# Patient Record
Sex: Male | Born: 1965 | Race: Asian | Hispanic: No | Marital: Married | Smoking: Never smoker
Health system: Southern US, Community
[De-identification: ages and names within clinical notes are randomized; demographics above are authoritative.]

## PROBLEM LIST (undated history)

## (undated) DIAGNOSIS — R7302 Impaired glucose tolerance (oral): Secondary | ICD-10-CM

## (undated) DIAGNOSIS — D751 Secondary polycythemia: Secondary | ICD-10-CM

## (undated) DIAGNOSIS — F419 Anxiety disorder, unspecified: Secondary | ICD-10-CM

## (undated) DIAGNOSIS — E569 Vitamin deficiency, unspecified: Secondary | ICD-10-CM

## (undated) DIAGNOSIS — R208 Other disturbances of skin sensation: Secondary | ICD-10-CM

## (undated) DIAGNOSIS — H539 Unspecified visual disturbance: Secondary | ICD-10-CM

## (undated) DIAGNOSIS — E785 Hyperlipidemia, unspecified: Secondary | ICD-10-CM

## (undated) HISTORY — DX: Impaired glucose tolerance (oral): R73.02

## (undated) HISTORY — PX: ANAL FISSURE REPAIR: SHX2312

## (undated) HISTORY — DX: Vitamin deficiency, unspecified: E56.9

## (undated) HISTORY — PX: TONSILLECTOMY: SUR1361

## (undated) HISTORY — DX: Secondary polycythemia: D75.1

## (undated) HISTORY — DX: Anxiety disorder, unspecified: F41.9

## (undated) HISTORY — DX: Unspecified visual disturbance: H53.9

## (undated) HISTORY — DX: Hyperlipidemia, unspecified: E78.5

## (undated) HISTORY — DX: Other disturbances of skin sensation: R20.8

---

## 2008-05-05 ENCOUNTER — Ambulatory Visit: Payer: Self-pay | Admitting: Pulmonary Disease

## 2008-05-05 DIAGNOSIS — R05 Cough: Secondary | ICD-10-CM

## 2008-05-18 ENCOUNTER — Encounter: Admission: RE | Admit: 2008-05-18 | Discharge: 2008-05-18 | Payer: Self-pay | Admitting: Internal Medicine

## 2008-10-07 ENCOUNTER — Ambulatory Visit (HOSPITAL_COMMUNITY): Admission: RE | Admit: 2008-10-07 | Discharge: 2008-10-07 | Payer: Self-pay | Admitting: Gastroenterology

## 2008-10-07 IMAGING — CT CT ABDOMEN W/ CM
2 of 5 series · 17 of 46 positions shown, 19 images · IV contrast (agent unspecified)
Comparison: None.

CLINICAL DATA: Right upper quadrant abdominal pain.

CT ABDOMEN WITH CONTRAST
TECHNIQUE: Multidetector CT imaging of the abdomen was performed
following the standard protocol during bolus administration of
intravenous contrast.
Contrast: 100 ml [JI]

[Series 2: abdomen 5.0 b40s · axial · 0.76mm/px · z∈[-317,-57]mm · 14 of 60 slices shown, 16 images]
[im 4/60  soft-tissue]
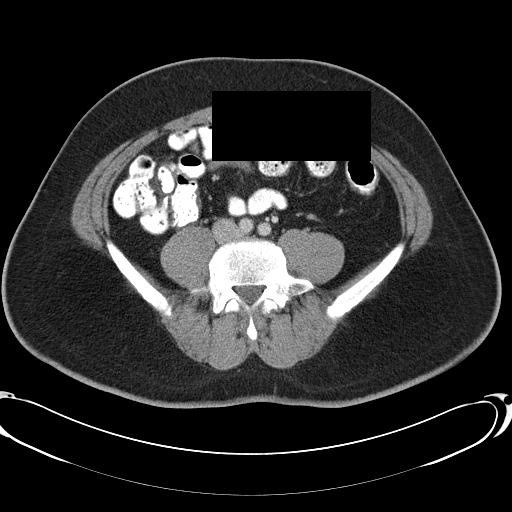
[im 4/60  bone]
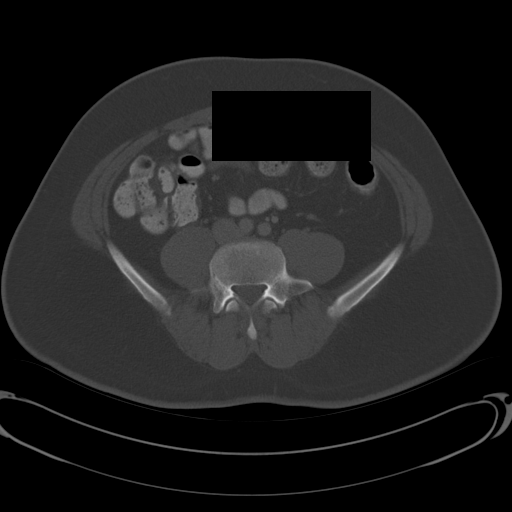
[im 8/60  soft-tissue]
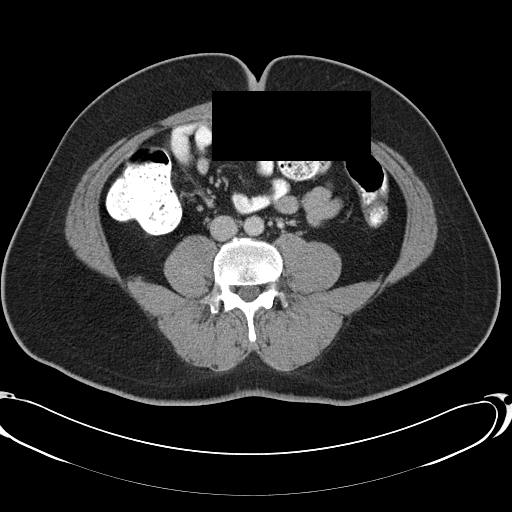
[im 12/60  soft-tissue]
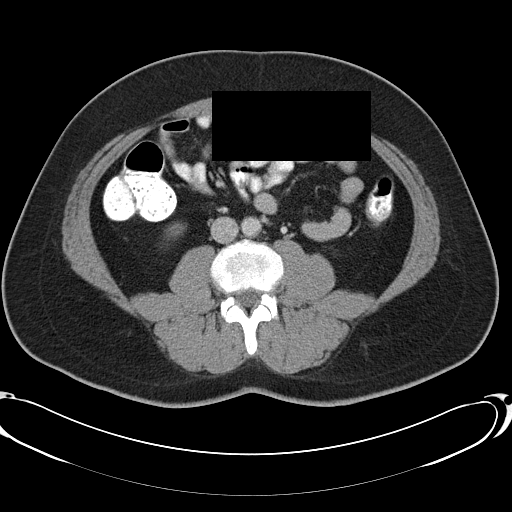
[im 15/60  soft-tissue]
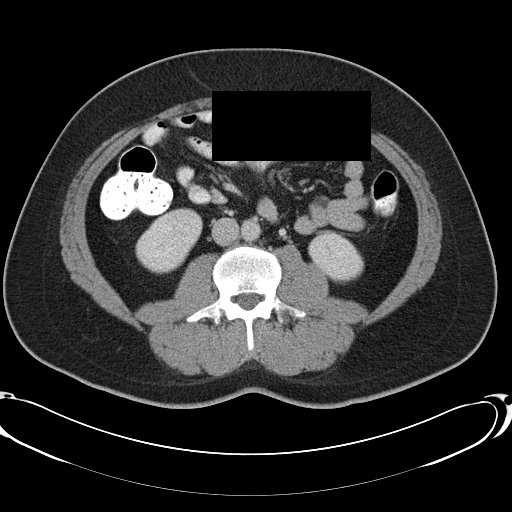
[im 19/60  soft-tissue]
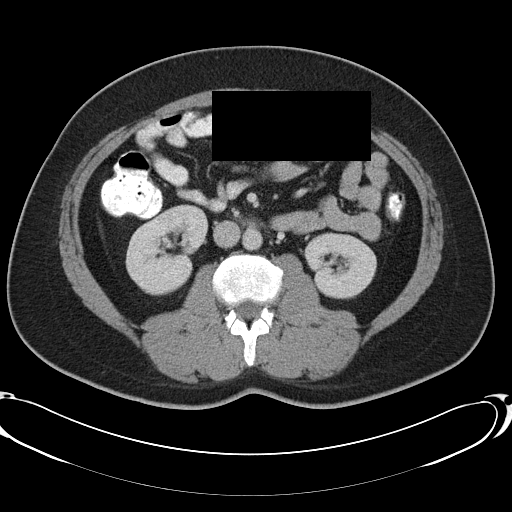
[im 23/60  soft-tissue]
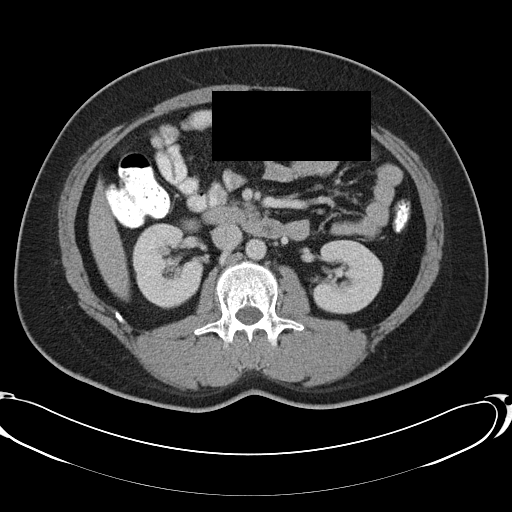
[im 26/60  soft-tissue]
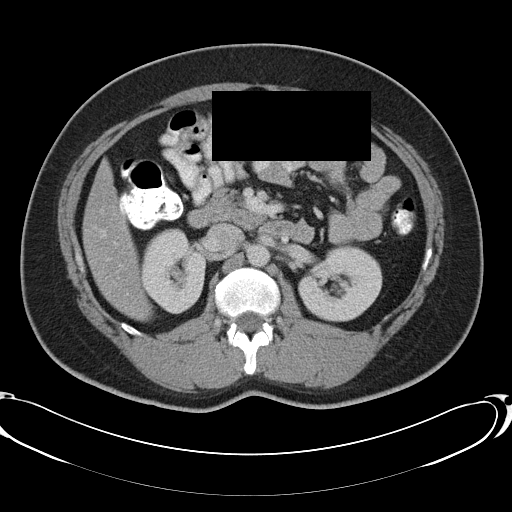
[im 34/60  soft-tissue]
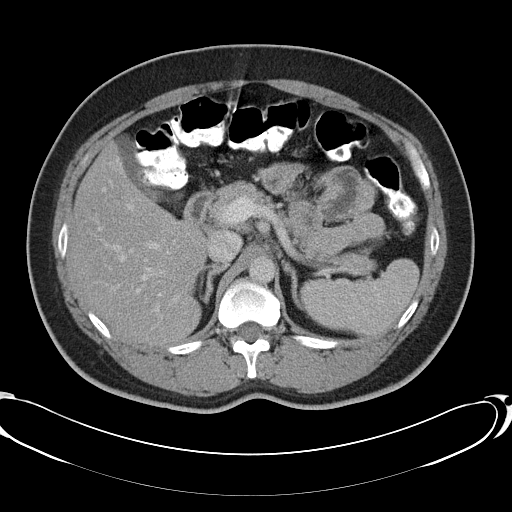
[im 37/60  soft-tissue]
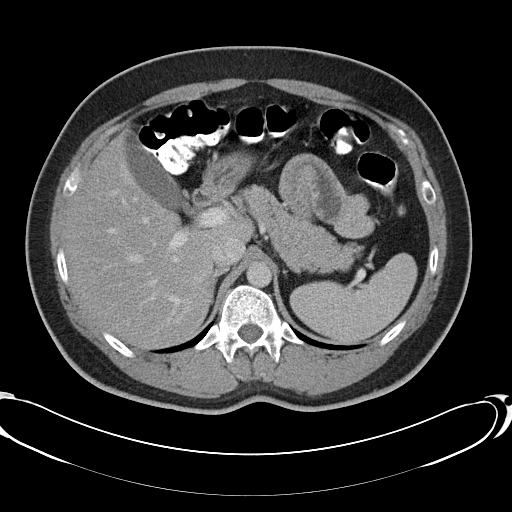
[im 37/60  bone]
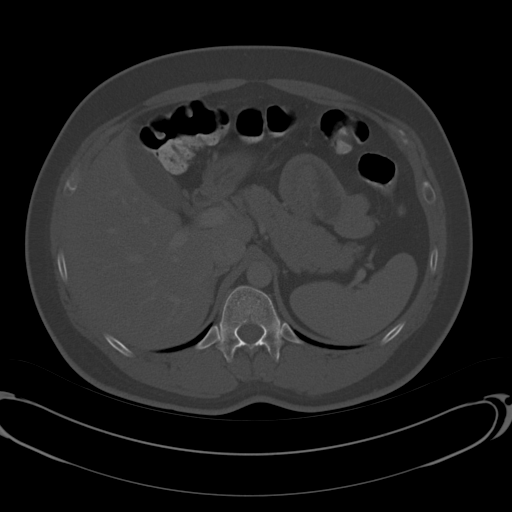
[im 41/60  soft-tissue]
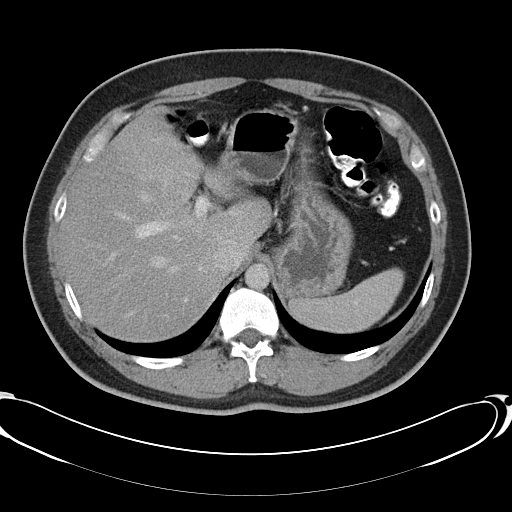
[im 45/60  soft-tissue]
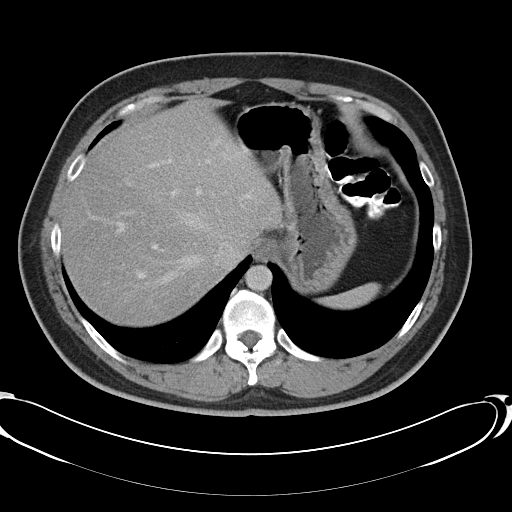
[im 48/60  soft-tissue]
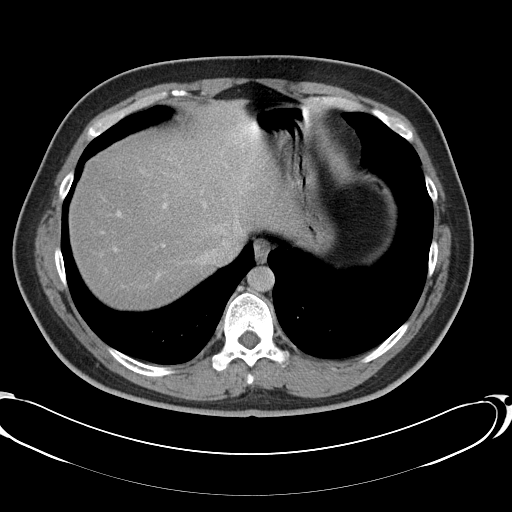
[im 52/60  soft-tissue]
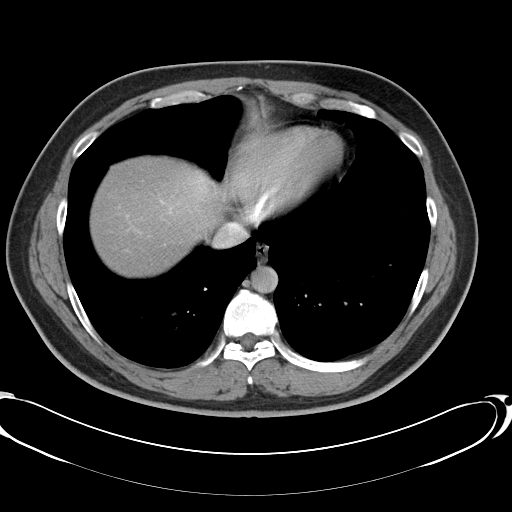
[im 56/60  soft-tissue]
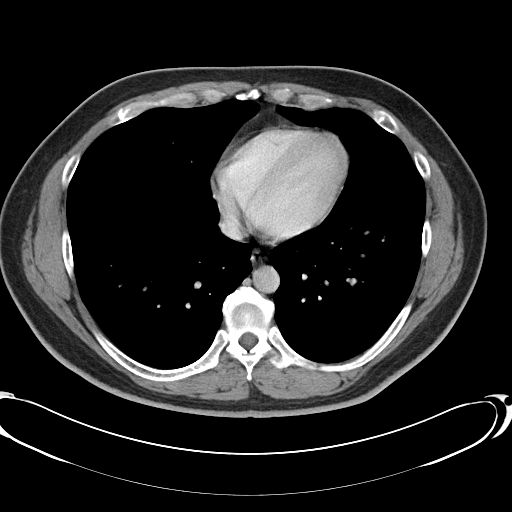

[Series 602: <mpr thick range> · coronal · 0.76mm/px · 3 of 82 slices shown]
[im 28/82  soft-tissue]
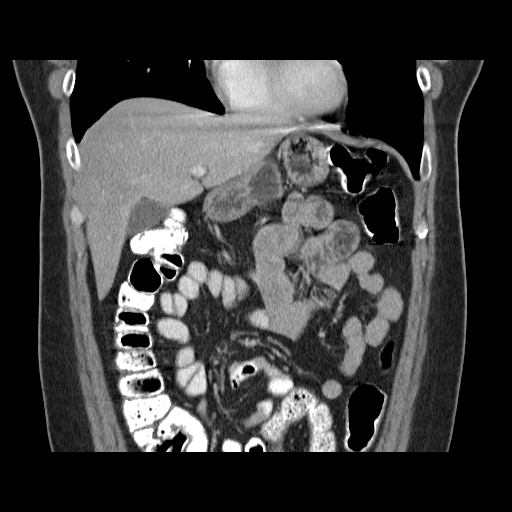
[im 37/82  soft-tissue]
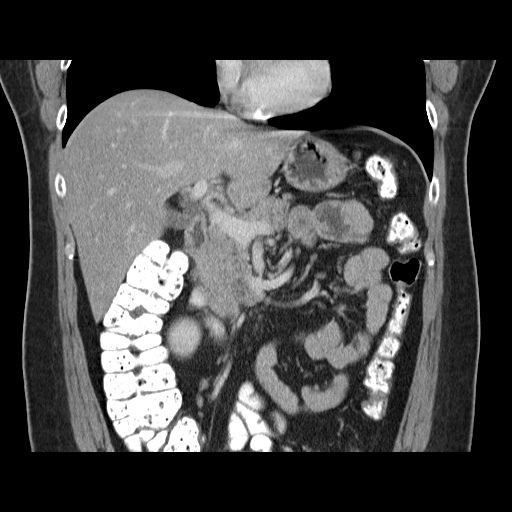
[im 46/82  soft-tissue]
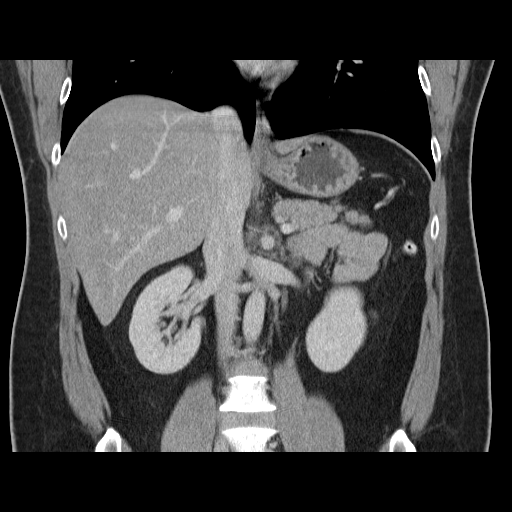

[17 of 46 positions shown; findings below may reference images not displayed]

FINDINGS: The liver shows diffusely decreased attenuation,
consistent with a degree of steatosis.  Craniocaudal length of the
liver is 18.3 cm.  No focal abnormalities identified in the liver
or spleen.  The stomach, duodenum, pancreas, gallbladder, adrenal
glands, and right kidney show normal imaging features.  An 11 mm
well-defined hypoattenuating lesion in the central left kidney is
likely a cyst.

There is no abdominal aortic aneurysm.  No intraperitoneal free
fluid.  No abdominal lymphadenopathy.

Bone windows show no focal lytic or sclerotic osseous lesion.
IMPRESSION: Fatty, mildly enlarged liver without focal abnormality.

Tiny hypodensity in the central left kidney is most likely a cyst.

## 2009-07-26 ENCOUNTER — Ambulatory Visit (HOSPITAL_COMMUNITY): Admission: RE | Admit: 2009-07-26 | Discharge: 2009-07-26 | Payer: Self-pay | Admitting: Neurosurgery

## 2009-08-24 ENCOUNTER — Encounter: Admission: RE | Admit: 2009-08-24 | Discharge: 2009-10-10 | Payer: Self-pay | Admitting: Neurosurgery

## 2009-12-04 ENCOUNTER — Telehealth: Payer: Self-pay | Admitting: *Deleted

## 2010-11-27 NOTE — Progress Notes (Signed)
Summary: LMTCB for appt.  Phone Note Call from Patient   Caller: Mom Call For: Berniece Andreas, MD Summary of Call: Pt's mom left message requesting appt with Dr. Fabian Sharp,. LMTCB for more information.  Initial call taken by: Lynann Beaver CMA,  December 04, 2009 11:45 AM  Follow-up for Phone Call        Pt to come in today for a 1pm appt. Follow-up by: Romualdo Bolk, CMA (AAMA),  December 04, 2009 12:13 PM

## 2011-01-04 ENCOUNTER — Other Ambulatory Visit (INDEPENDENT_AMBULATORY_CARE_PROVIDER_SITE_OTHER): Payer: 59 | Admitting: Internal Medicine

## 2011-01-04 DIAGNOSIS — Z209 Contact with and (suspected) exposure to unspecified communicable disease: Secondary | ICD-10-CM

## 2011-07-10 ENCOUNTER — Other Ambulatory Visit: Payer: Self-pay | Admitting: Gastroenterology

## 2011-07-11 ENCOUNTER — Ambulatory Visit
Admission: RE | Admit: 2011-07-11 | Discharge: 2011-07-11 | Disposition: A | Discharge status: Home / Self Care | Payer: 59 | Source: Ambulatory Visit | Attending: Gastroenterology | Admitting: Gastroenterology

## 2012-12-12 ENCOUNTER — Other Ambulatory Visit: Payer: Self-pay

## 2013-09-02 ENCOUNTER — Other Ambulatory Visit: Payer: Self-pay

## 2014-07-08 ENCOUNTER — Encounter: Payer: Self-pay | Admitting: Family Medicine

## 2015-01-05 ENCOUNTER — Ambulatory Visit (HOSPITAL_COMMUNITY)
Admission: RE | Admit: 2015-01-05 | Discharge: 2015-01-05 | Disposition: A | Discharge status: Home / Self Care | Payer: 59 | Source: Ambulatory Visit | Attending: Internal Medicine | Admitting: Internal Medicine

## 2015-01-05 ENCOUNTER — Other Ambulatory Visit (HOSPITAL_COMMUNITY): Payer: Self-pay | Admitting: Internal Medicine

## 2015-01-05 DIAGNOSIS — G44209 Tension-type headache, unspecified, not intractable: Secondary | ICD-10-CM

## 2015-09-26 ENCOUNTER — Encounter: Payer: Self-pay | Admitting: Diagnostic Neuroimaging

## 2015-09-26 ENCOUNTER — Ambulatory Visit (INDEPENDENT_AMBULATORY_CARE_PROVIDER_SITE_OTHER): Payer: 59 | Admitting: Diagnostic Neuroimaging

## 2015-09-26 ENCOUNTER — Encounter: Payer: Self-pay | Admitting: *Deleted

## 2015-09-26 VITALS — BP 122/76 | HR 74 | Resp 16 | Ht 73.0 in | Wt 203.0 lb

## 2015-09-26 DIAGNOSIS — M79672 Pain in left foot: Secondary | ICD-10-CM | POA: Diagnosis not present

## 2015-09-26 DIAGNOSIS — M79671 Pain in right foot: Secondary | ICD-10-CM

## 2015-09-26 DIAGNOSIS — R200 Anesthesia of skin: Secondary | ICD-10-CM

## 2015-09-26 DIAGNOSIS — R208 Other disturbances of skin sensation: Secondary | ICD-10-CM | POA: Diagnosis not present

## 2015-09-26 MED ORDER — METANX 3-35-2 MG PO TABS: 1.0000 | ORAL_TABLET | Freq: Every day | ORAL | Status: DC

## 2015-09-26 NOTE — Patient Instructions (Signed)
Thank you for coming to see [place] at [facility]. I hope we have been able to provide you high quality care today.  You may receive a patient satisfaction survey over the next few weeks. We would appreciate your feedback and comments so that we may continue to improve ourselves and the health of our patients.  - try metanx 1 tab twice a day or 2 tabs daily   ~~~~~~~~~~~~~~~~~~~~~~~~~~~~~~~~~~~~~~~~~~~~~~~~~~~~~~~~~~~~~~~~~  DR. Christopher Hink'S GUIDE TO HAPPY AND HEALTHY LIVING These are some of my general health and wellness recommendations. Some of them may apply to you better than others. Please use common sense as you try these suggestions and feel free to ask me any questions.   ACTIVITY/FITNESS Mental, social, emotional and physical stimulation are very important for brain and body health. Try learning a new activity (arts, music, language, sports, games).  Keep moving your body to the best of your abilities. You can do this at home, inside or outside, the park, community center, gym or anywhere you like. Consider a physical therapist or personal trainer to get started. Consider the app Sworkit. Fitness trackers such as smart-watches, smart-phones or Fitbits can help as well.   NUTRITION Eat more plants: colorful vegetables, nuts, seeds and berries.  Eat less sugar, salt, preservatives and processed foods.  Avoid toxins such as cigarettes and alcohol.  Drink water when you are thirsty. Warm water with a slice of lemon is an excellent morning drink to start the day.  Consider these websites for more information The Nutrition Source (https://www.henry-hernandez.biz/) Precision Nutrition (WindowBlog.ch)   RELAXATION Consider practicing mindfulness meditation or other relaxation techniques such as deep breathing, prayer, yoga, tai chi, massage. See website mindful.org or the apps Headspace or Calm to help get  started.   SLEEP Try to get at least 7-8+ hours sleep per day. Regular exercise and reduced caffeine will help you sleep better. Practice good sleep hygeine techniques. See website sleep.org for more information.   PLANNING Prepare estate planning, living will, healthcare POA documents. Sometimes this is best planned with the help of an attorney. Theconversationproject.org and agingwithdignity.org are excellent resources.

## 2015-09-26 NOTE — Progress Notes (Signed)
[facility]  PATIENT: Dustin [name] DOB: 29-Apr-1966  REFERRING CLINICIAN: Ashby Dawes HISTORY FROM: patient  REASON FOR VISIT: new consult    HISTORICAL  CHIEF COMPLAINT:  Chief Complaint  Patient presents with  . 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  Also c/o intermittent cold sensation entire legs,  buttocks onset about 3 weeks ago.  He has not tried any medications for sx. and prefers not to/fim    HISTORY OF PRESENT ILLNESS:   49 year old male here for evaluation of numbness, burning in the feet for past 1 month.  Approximate one month ago patient had onset of intermittent burning sensation in the bottom of the feet. Initially right foot was more affected than the left. Symptoms would fluctuate and seem to be aggravated if he was sitting upright or standing upright for long periods of time. Over the past one month symptoms have gradually increased in severity, frequency and duration. He has had several episodes of a cold sensation, migratory in the bilateral lower extremities ranging from the calves, thighs and hip regions. He has had some intermittent pain in the left upper thoracic region which has spontaneously subsided.  No focal weakness, balance difficulty, problems with fingers hands or arms. No vision changes, headaches, slurred speech or trouble talking.  Patient has history of vitamin D deficiency and is on vitamin D replacement therapy. Patient had hemoglobin A1c which was borderline at 6.1. Patient has family history of diabetes and patient's father has similar burning sensation in the feet.  No prodromal infections, traumas or other triggering factors.    REVIEW OF SYSTEMS: Full 14 system review of systems performed and notable only as per history of present illness.  ALLERGIES: No Known Allergies  HOME MEDICATIONS: No outpatient prescriptions prior to visit.    No facility-administered medications prior to visit.    PAST MEDICAL HISTORY: Past Medical History  Diagnosis Date  . Vision abnormalities     PAST SURGICAL HISTORY: Past Surgical History  Procedure Laterality Date  . Tonsillectomy      FAMILY HISTORY: Family History  Problem Relation Age of Onset  . Other Mother   . Diabetes type II Father   . Coronary artery disease Father     SOCIAL HISTORY:  Social History   Social History  . Marital Status: Married    Spouse Name: N/A  . Number of Children: N/A  . Years of Education: N/A   Occupational History  . Not on file.   Social History Main Topics  . Smoking status: Never Smoker   . Smokeless tobacco: Not on file  . Alcohol Use: 0.0 oz/week    0 Standard drinks or equivalent per week     Comment: occasional  . Drug Use: No  . Sexual Activity: Not on file   Other Topics Concern  . Not on file   Social History Narrative  . No narrative on file     PHYSICAL EXAM  GENERAL EXAM/CONSTITUTIONAL: Vitals:  Filed Vitals:   09/26/15 1514  BP: 122/76  Pulse: 74  Resp: 16  Height: 6\' 1"  (1.854 m)  Weight: 203 lb (92.08 kg)     Body mass index is 26.79 kg/(m^2).  No exam data present  Patient is in no distress; well developed, nourished and groomed; neck is supple  STRAIGHT LEG RAISE NEGATIVE   CARDIOVASCULAR:  Examination of carotid arteries is normal; no carotid bruits  Regular rate and rhythm, no murmurs  Examination of peripheral vascular system by observation and palpation is normal  EYES:  Ophthalmoscopic exam of optic discs and posterior segments is normal; no papilledema or hemorrhages  MUSCULOSKELETAL:  Gait, strength, tone, movements noted in Neurologic exam below  NEUROLOGIC: MENTAL STATUS:  No flowsheet data found.  awake, alert, oriented to person, place and time  recent and remote memory intact  normal attention and concentration  language fluent, comprehension  intact, naming intact,   fund of knowledge appropriate  CRANIAL NERVE:   2nd - no papilledema on fundoscopic exam  2nd, 3rd, 4th, 6th - pupils equal and reactive to light, visual fields full to confrontation, extraocular muscles intact, no nystagmus  5th - facial sensation symmetric  7th - facial strength symmetric  8th - hearing intact  9th - palate elevates symmetrically, uvula midline  11th - shoulder shrug symmetric  12th - tongue protrusion midline  MOTOR:   normal bulk and tone, full strength in the BUE, BLE  SENSORY:   normal and symmetric to light touch, temperature and proprioception; BORDERLINE REDUCED VIBRATION AT TOES (~8 SEC); SLIGHTLY DECREASED PP SENSATION IN BOTTOM OF FEET (SYMM)  COORDINATION:   finger-nose-finger, fine finger movements normal  REFLEXES:   deep tendon reflexes present and symmetric  GAIT/STATION:   narrow based gait; able to walk on toes, heels and tandem; romberg is negative    DIAGNOSTIC DATA (LABS, IMAGING, TESTING) - I reviewed patient records, labs, notes, testing and imaging myself where available.  No results found for: WBC, HGB, HCT, MCV, PLT No results found for: NA, K, CL, CO2, GLUCOSE, BUN, CREATININE, CALCIUM, PROT, ALBUMIN, AST, ALT, ALKPHOS, BILITOT, GFRNONAA, GFRAA No results found for: CHOL, HDL, LDLCALC, LDLDIRECT, TRIG, CHOLHDL No results found for: HGBA1C No results found for: VITAMINB12 No results found for: TSH   OUTSIDE LABS: A1c 6.1, VitD 28, VitB12 1372, TSH 2.630   07/27/09 MRI LUMBAR [I reviewed images myself and agree with interpretation. -VRP]  - Minimal desiccation and bulging of the L3-4 disc. No apparent neural compression. - Minimal desiccation and bulging of the L4-5 disc. Minimal facet degeneration. No apparent compressive stenosis. No pronounced facet arthropathy.        ASSESSMENT AND PLAN  49 y.o. year old male here with new onset of burning sensation in bottom of feet for  past 1 month. Also with intermittent migratory cool sensations in bilateral legs. Neurologic examination notable for slight decreased sensation in bottom of feet and toes but otherwise normal. Findings could represent onset of small fiber neuropathy. No objective evidence for central nervous system pathologic process at this time. Borderline diabetes or impaired glucose tolerance are common causes for this problem. Other screening lab testings per PCP have been unremarkable.  At this time I advised observation over the next 1 month and repeat neurologic examination. In the future we may consider EMG/nerve conduction testing, skin biopsy for epidermal nerve fiber density testing, as well as further screening for less common etiologies of neuropathy including autoimmune, inflammatory, demyelinating conditions. If signs or symptoms change in the future, could consider brain and spinal cord imaging.   Ddx: small fiber neuropathy (impaired glucose tolerance, autoimmune, inflamm, infectious, hereditary, idiopathic) vs lumbar radiculopathy/spinal stenosis   PLAN: - Observation for the next 1 month - Trial of vitamin support (Metanx) - Encouraged improvements to nutrition and physical activity  Meds ordered this encounter  Medications  . multivitamin (METANX) 3-35-2 MG TABS tablet    Sig:  Take 1-2 tablets by mouth daily.    Dispense:  60 tablet    Refill:  12   Return in about 1 month (around 10/26/2015).    Penni Bombard, MD [id number], [id number] PM Certified in Neurology, Neurophysiology and Neuroimaging  [facility] [address] [place], [place] [address] [phone]

## 2015-11-21 ENCOUNTER — Ambulatory Visit: Payer: 59 | Admitting: Diagnostic Neuroimaging

## 2015-12-04 MED FILL — METFORMIN HCL ER 500 MG TAB: 500 | 90 days supply | Qty: 90 | Fill #1

## 2015-12-21 MED FILL — predniSONE 5 MG (21) TBPK: 5 | 6 days supply | Qty: 21 | Fill #0

## 2015-12-29 ENCOUNTER — Other Ambulatory Visit (INDEPENDENT_AMBULATORY_CARE_PROVIDER_SITE_OTHER): Payer: 59

## 2015-12-29 ENCOUNTER — Encounter: Payer: Self-pay | Admitting: Neurology

## 2015-12-29 ENCOUNTER — Ambulatory Visit (INDEPENDENT_AMBULATORY_CARE_PROVIDER_SITE_OTHER): Payer: 59 | Admitting: Neurology

## 2015-12-29 VITALS — BP 104/70 | HR 95 | Ht 73.0 in | Wt 203.3 lb

## 2015-12-29 DIAGNOSIS — G609 Hereditary and idiopathic neuropathy, unspecified: Secondary | ICD-10-CM

## 2015-12-29 DIAGNOSIS — R209 Unspecified disturbances of skin sensation: Secondary | ICD-10-CM

## 2015-12-29 LAB — COMPREHENSIVE METABOLIC PANEL
ALT: 44 U/L (ref 0–53)
AST: 21 U/L (ref 0–37)
Albumin: 4.6 g/dL (ref 3.5–5.2)
Alkaline Phosphatase: 64 U/L (ref 39–117)
BUN: 20 mg/dL (ref 6–23)
CHLORIDE: 105 meq/L (ref 96–112)
CO2: 30 meq/L (ref 19–32)
CREATININE: 1.1 mg/dL (ref 0.40–1.50)
Calcium: 9.4 mg/dL (ref 8.4–10.5)
GFR: 75.98 mL/min (ref >60.00)
Glucose, Bld: 98 mg/dL (ref 70–99)
Potassium: 4.1 mEq/L (ref 3.5–5.1)
SODIUM: 140 meq/L (ref 135–145)
Total Bilirubin: 0.5 mg/dL (ref 0.2–1.2)
Total Protein: 7.1 g/dL (ref 6.0–8.3)

## 2015-12-29 LAB — CBC
HEMATOCRIT: 53.1 % — AB (ref 39.0–52.0)
Hemoglobin: 17.5 g/dL — ABNORMAL HIGH (ref 13.0–17.0)
MCHC: 33 g/dL (ref 30.0–36.0)
MCV: 89.6 fl (ref 78.0–100.0)
Platelets: 230 10*3/uL (ref 150.0–400.0)
RBC: 5.93 Mil/uL — AB (ref 4.22–5.81)
RDW: 12.9 % (ref 11.5–15.5)
WBC: 5.9 10*3/uL (ref 4.0–10.5)

## 2015-12-29 LAB — SEDIMENTATION RATE: Sed Rate: 2 mm/hr (ref 0–22)

## 2015-12-29 MED ORDER — GABAPENTIN 100 MG PO CAPS: ORAL_CAPSULE | ORAL | Status: DC

## 2015-12-29 MED FILL — ETODOLAC 400 MG TABLET: 400 | 30 days supply | Qty: 60 | Fill #0

## 2015-12-29 MED FILL — CYCLOBENZAPRINE 10 MG TAB: 10 | 30 days supply | Qty: 30 | Fill #0

## 2015-12-29 MED FILL — GABAPENTIN 100 MG CAPSULE: 100 | 30 days supply | Qty: 60 | Fill #0

## 2015-12-29 NOTE — Progress Notes (Signed)
Note routed

## 2015-12-29 NOTE — Patient Instructions (Signed)
1.  Check NCS/EMG of the lower extremities 2.  Check blood work 3.  Start gabapentin 100mg  at bedtime for 3 days, then increase to 2 tablets at bedtime  We will call you with the results of the testing and post them on MyChart

## 2015-12-29 NOTE — Progress Notes (Signed)
[facility] Clinic Note - Initial Visit   Date: 12/29/2015  Dustin [name] MRN: [id number] DOB: February 20, 1966   Dear Dr. Ashby Dawes:  Thank you for your kind referral of Dustin [name] [facility] for consultation of abnormal skin sensation. Although his history is well known to you, please allow [place] to reiterate it for the purpose of our medical record. The patient was accompanied to the clinic by self.     History of Present Illness: Dustin [name] is a 50 y.o. right-handed [place] male with impaired glucose tolerance and vitamin D deficiency presenting for evaluation of abnormal skin sensation.    He reports having transient spells of sharp burning pain in the heels for a number of years.  Starting in November 2016, he began having a burning sensation involving the soles of the feet, lasting several minutes to hours.   It can come in spells lasting for weeks and then resolving spontaneously.  He notices a discomfort when he gets up in the morning and his feet touch the ground and throughout the day, but usually does not have this discomfort when sleeping.  It does not involve the dorsum of the foot or toes.    Also around the same time, has spells of migratory cold/heat sensation occuring randomly over the thighs, legs, and occasionally in the mid back. No alleviating or exacerbating factors. It can occur with activity or at rest. There is no numbness/tingling, weakness, or imbalance. He denies dry eyes, dry mouth.  He does not have similar symptoms involving the upper extremities.   He has impaired glucose intolerance and due to concern of possible early neuropathy, he was started on metformin 540m at bedtime but has not noticed any improvement. He underwent a series of lab testing with his PCP screening for inflammation, thyroid disease, and vitamin B12 deficiency which returned normal.  [facility]    Out-side paper  records, electronic medical record, and images have been reviewed where available and summarized as:  Labs 08/09/2015:  Vitamin B12 1372, vitamin D 28.0*, TSH 2.630, HbA1c 6.1  Past Medical History  Diagnosis Date  . Vision abnormalities     Past Surgical History  Procedure Laterality Date  . Tonsillectomy       Medications:  Outpatient Encounter Prescriptions as of 12/29/2015  Medication Sig Note  . cholecalciferol (VITAMIN D) 1000 UNITS tablet Take 5,000 Units by mouth every other day.   . cyanocobalamin 1000 MCG tablet Take 5,000 mcg by mouth every other day.   . metFORMIN (GLUCOPHAGE-XR) 500 MG 24 hr tablet  12/29/2015: Received from: External Pharmacy  . multivitamin (METANX) 3-35-2 MG TABS tablet Take 1-2 tablets by mouth daily. (Patient taking differently: Take 1-2 tablets by mouth 2 (two) times daily. ) 09/26/2015: Rx. faxed to [facility] by  [employer] [employer] fax # [phone]  No facility-administered encounter medications on file as of 12/29/2015.     Allergies: No Known Allergies  Family History: Family History  Problem Relation Age of Onset  . Other Mother   . Diabetes type II Father   . Coronary artery disease Father     Social History: Social History  Substance Use Topics  . Smoking status: Never Smoker   . Smokeless tobacco: Never Used  . Alcohol Use: 0.0 oz/week    0 Standard drinks or equivalent per week     Comment: occasional   Social History   Social History Narrative  Lives with wife and 2 children in a 2 story home.  Works as a Engineer, drilling for [employer].    Review of Systems:  CONSTITUTIONAL: No fevers, chills, night sweats, or weight loss.   EYES: No visual changes or eye pain ENT: No hearing changes.  No history of nose bleeds.   RESPIRATORY: No cough, wheezing and shortness of breath.   CARDIOVASCULAR: Negative for chest pain, and palpitations.   GI: Negative for abdominal discomfort, blood in stools or black stools.  No recent  change in bowel habits.   GU:  No history of incontinence.   MUSCLOSKELETAL: No history of joint pain or swelling.  No myalgias.   SKIN: Negative for lesions, rash, and itching.   HEMATOLOGY/ONCOLOGY: Negative for prolonged bleeding, bruising easily, and swollen nodes.  No history of cancer.   ENDOCRINE: Negative for cold or heat intolerance, polydipsia or goiter.   PSYCH:  No depression or anxiety symptoms.   NEURO: As Above.   Vital Signs:  BP 104/70 mmHg  Pulse 95  Ht _0  (1.854 m)  Wt 203 lb 5 oz (92.222 kg)  BMI 26.83 kg/m2  SpO2 96% Pain Scale: 0 on a scale of 0-10   General Medical Exam:   General:  Well appearing, comfortable.   Eyes/ENT: see cranial nerve examination.   Neck: No masses appreciated.  Full range of motion without tenderness.  No carotid bruits. Respiratory:  Clear to auscultation, good air entry bilaterally.   Cardiac:  Regular rate and rhythm, no murmur.   Extremities:  No deformities, edema, or skin discoloration.  Skin:  No rashes or lesions.  Clammy hands.   Neurological Exam: MENTAL STATUS including orientation to time, place, person, recent and remote memory, attention span and concentration, language, and fund of knowledge is normal.  Speech is not dysarthric.  CRANIAL NERVES: II:  No visual field defects.  Unremarkable fundi.   III-IV-VI: Pupils equal round and reactive to light.  Normal conjugate, extra-ocular eye movements in all directions of gaze.  No nystagmus.  No ptosis.   V:  Normal facial sensation.    VII:  Normal facial symmetry and movements.  No pathologic facial reflexes.  VIII:  Normal hearing and vestibular function.   IX-X:  Normal palatal movement.   XI:  Normal shoulder shrug and head rotation.   XII:  Normal tongue strength and range of motion, no deviation or fasciculation.  MOTOR:  No atrophy, fasciculations or abnormal movements.  No pronator drift.  Tone is normal.    Right Upper Extremity:    Left Upper Extremity:     Deltoid  5/5   Deltoid  5/5   Biceps  5/5   Biceps  5/5   Triceps  5/5   Triceps  5/5   Wrist extensors  5/5   Wrist extensors  5/5   Wrist flexors  5/5   Wrist flexors  5/5   Finger extensors  5/5   Finger extensors  5/5   Finger flexors  5/5   Finger flexors  5/5   Dorsal interossei  5/5   Dorsal interossei  5/5   Abductor pollicis  5/5   Abductor pollicis  5/5   Tone (Ashworth scale)  0  Tone (Ashworth scale)  0   Right Lower Extremity:    Left Lower Extremity:    Hip flexors  5/5   Hip flexors  5/5   Hip extensors  5/5   Hip extensors  5/5   Knee  flexors  5/5   Knee flexors  5/5   Knee extensors  5/5   Knee extensors  5/5   Dorsiflexors  5/5   Dorsiflexors  5/5   Plantarflexors  5/5   Plantarflexors  5/5   Toe extensors  5/5   Toe extensors  5/5   Toe flexors  5-/5   Toe flexors  5-/5   Tone (Ashworth scale)  0  Tone (Ashworth scale)  0   MSRs:  Right                                                                 Left brachioradialis 2+  brachioradialis 2+  biceps 2+  biceps 2+  triceps 2+  triceps 2+  patellar 2+  patellar 2+  ankle jerk 2+  ankle jerk 2+  Hoffman no  Hoffman no  plantar response down  plantar response down   SENSORY:  Normal and symmetric perception of light touch, pinprick, vibration, and proprioception.  Romberg's sign absent.   COORDINATION/GAIT: Normal finger-to- nose-finger and heel-to-shin.  Intact rapid alternating movements bilaterally.  Able to rise from a chair without using arms.  Gait narrow based and stable. Tandem and stressed gait intact.    IMPRESSION: Dustin [name] is a 50 year-old gentleman referred for evaluation of transient spells of bilateral feet paresthesias and migratory dysesthesias involving his lower legs since November 2016.  His exam is notable for mild distal toe flexion weakness with normal sensation and reflexes. Certainly, a distal and symmetric polyneuropathy remains a possibility cause his burning paresthesias of the  feet however this would not explain the abnormal temperature sensation over his legs as this does not follow a dermatomal or cutaneous nerve distribution.   I had extensive discussion with the patient regarding the pathogenesis, etiology, management, and natural course of neuropathy. Neuropathy tends to be slowly progressive, especially if a treatable etiology is not identified.  I would like to test for treatable causes of neuropathy.  Even though he has mild impaired glucose tolerance, I still would like to investigate for other treatable causes of neuropathy.  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  I will proceed with NCS/EMG of the legs and determine whether skin biopsy will be needed pending the results.     PLAN/RECOMMENDATIONS:  1.  Check ESR, vitamin B1, vitamin B6, SPEP/UPEP with IFE, celiac panel, CMP, CBC, copper, zinc, ceruloplasmin, heavy metal screen 2.  NCS/EMG of the lower legs 3.  Start gabapentin 149m at bedtime and titrate as needed  Telephone update with results.    The duration of this appointment visit was 40 minutes of face-to-face time with the patient.  Greater than 50% of this time was spent in counseling, explanation of diagnosis, planning of further management, and coordination of care.   Thank you for allowing me to participate in patient's care.  If I can answer any additional questions, I would be pleased to do so.    Sincerely,    Donika K. PPosey Pronto DO

## 2016-01-01 LAB — CERULOPLASMIN: CERULOPLASMIN: 22 mg/dL (ref 18–36)

## 2016-01-01 LAB — GLIADIN ANTIBODIES, SERUM
GLIADIN IGA: 3 U (ref <20)
GLIADIN IGG: 2 U (ref <20)

## 2016-01-01 LAB — TISSUE TRANSGLUTAMINASE, IGA: Tissue Transglutaminase Ab, IgA: 1 U/mL (ref <4)

## 2016-01-02 LAB — PROTEIN ELECTROPHORESIS,RANDOM URN
Creatinine, Urine: 352 mg/dL (ref 20–370)
PROTEIN CREATININE RATIO: 45 mg/g{creat} (ref 22–128)
Total Protein, Urine: 16 mg/dL (ref 5–25)

## 2016-01-02 LAB — IMMUNOFIXATION ELECTROPHORESIS
IGA: 310 mg/dL (ref 68–379)
IGG (IMMUNOGLOBIN G), SERUM: 1030 mg/dL (ref 650–1600)
IGM, SERUM: 16 mg/dL — AB (ref 41–251)

## 2016-01-02 LAB — PROTEIN ELECTROPHORESIS, SERUM
ALBUMIN ELP: 4.1 g/dL (ref 3.8–4.8)
Alpha-1-Globulin: 0.3 g/dL (ref 0.2–0.3)
Alpha-2-Globulin: 0.6 g/dL (ref 0.5–0.9)
BETA GLOBULIN: 0.5 g/dL (ref 0.4–0.6)
Beta 2: 0.4 g/dL (ref 0.2–0.5)
Gamma Globulin: 0.9 g/dL (ref 0.8–1.7)
TOTAL PROTEIN, SERUM ELECTROPHOR: 6.7 g/dL (ref 6.1–8.1)

## 2016-01-02 LAB — IMMUNOFIXATION INTE

## 2016-01-02 LAB — RETICULIN ANTIBODIES, IGA W TITER: Reticulin Ab, IgA: NEGATIVE

## 2016-01-02 LAB — HEAVY METALS PANEL, BLOOD
Arsenic: <3 mcg/L (ref <23)
LEAD: 1 ug/dL (ref <5)
Mercury, B: <4 mcg/L (ref <=10)

## 2016-01-02 LAB — ZINC: Zinc: 61 ug/dL (ref 60–130)

## 2016-01-03 LAB — VITAMIN B1: VITAMIN B1 (THIAMINE): 8 nmol/L (ref 8–30)

## 2016-01-04 ENCOUNTER — Encounter: Payer: Self-pay | Admitting: Neurology

## 2016-01-04 LAB — VITAMIN B6: VITAMIN B6: 11.1 ng/mL (ref 2.1–21.7)

## 2016-01-15 ENCOUNTER — Ambulatory Visit (INDEPENDENT_AMBULATORY_CARE_PROVIDER_SITE_OTHER): Payer: 59 | Admitting: Oncology

## 2016-01-15 DIAGNOSIS — R208 Other disturbances of skin sensation: Secondary | ICD-10-CM

## 2016-01-15 DIAGNOSIS — D751 Secondary polycythemia: Secondary | ICD-10-CM

## 2016-01-15 LAB — SAVE SMEAR

## 2016-01-15 NOTE — Progress Notes (Signed)
Patient ID: Dustin [name], male   DOB: Apr 14, 1966, 50 y.o.   MRN: [id number] New Patient Hematology   Dustin [name] [id number] [birth year] 50 y.o. 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 These are not paresthesias. No numbness. No tingling. He has had no problems with bladder or bowel control. He has no upper extremity symptoms. He has chronic sweatiness of the palms of his hands. He denies any headache, change in vision, dysphagia, dyspnea, chest pain or palpitations, no focal weakness, dysarthria, no skin rash or bruising. No increase in symptoms after a hot shower. No pruritus. Evaluation to date by his internist and 2 different neurologists have been unrevealing. The only possible laboratory abnormality is a high normal hemoglobin 17.5, hematocrit 53, MCV 90, with a low ESR 2 mm recorded on 12/29/2015. He has laboratory studies done back as far as 2004. Prior to this hemoglobin has been very constant at 16.5 g with hematocrit 48%. White count and platelet count are normal. His internist got a hemoglobin A1c level which was borderline at 6.1% and started him on Glucophage 500 mg at bedtime but this has not improved his symptoms either has vitamin B and vitamin D supplements. His wife tells him that he snores but he has tested his oxygen saturations and they are normal. BMI is normal.  Other studies done to date include: Spot urine protein normal 16 mg percent (5-25) Serum protein electrophoresis with no monoclonal proteins. Isolated decrease in IgM  immunoglobulin 16 mg percent (41-2 51) with normal IgG 1030 mg percent, and IgA 310 mg percent. Immunofixation electrophoresis normal Heavy-metal screen negative. CK and aldolase normal ANA and rheumatoid factor negative Celiac disease panel including reticulin antibodies, trans-glutaminase and gliotic antibodies all negative. TSH 2.6 Liver functions normal CRP normal 0.4 Thiamine level normal 11.1 (2.1-21.7) Vitamin B1 low normal 8 enthesis 8-10) Vitamin D borderline decreased 28 (30-100) Ceruloplasmin normal 22 (18-36) B12 normal Zinc level low-normal 61 (60-1 30) Testing for RPR in March 2012 negative.  PMH: Past Medical History  Diagnosis Date  . Vision abnormalities   . Impaired glucose tolerance   . Vitamin deficiency     Past Surgical History  Procedure Laterality Date  . Tonsillectomy    Surgery for rectal fissures.  Allergies: No Known Allergies  Medications:  Current outpatient prescriptions:  .  cholecalciferol (VITAMIN D) 1000 UNITS tablet, Take 5,000 Units by mouth every other day., Disp: , Rfl:  .  cyanocobalamin 1000 MCG tablet, Take 5,000 mcg by mouth every other day., Disp: , Rfl:  .  gabapentin (NEURONTIN) 100 MG capsule, Take 1 tablet at bedtime for 3 days, then increase to 2 tablet at bedtime as needed., Disp: 60 capsule, Rfl: 5 .  metFORMIN (GLUCOPHAGE-XR) 500 MG 24 hr tablet, , Disp: , Rfl: 6 .  multivitamin (METANX) 3-35-2 MG TABS tablet, Take 1-2 tablets by mouth daily. (Patient taking differently: Take 1-2 tablets by mouth 2 (two) times daily. ), Disp: 60 tablet, Rfl: 12  Social History: Married; son 28, daughter 12, both healthy  he has never smoked. He has never used smokeless tobacco.  he drinks alcohol, about 2 or 3 scotch drinks per month. He reports that he does not use illicit drugs.  Family History: Mother died when he was just a child in a freak accident. Father and paternal aunt with diabetes. 2 younger brothers one is a Engineer, drilling. Both  healthy. Family History  Problem Relation Age of Onset  . Other Mother     other  . Diabetes type II Father   . Coronary artery disease Father   . Healthy Brother   . Healthy Son   . Healthy Daughter   . Diabetes Other     Paternal aunt  . CAD Other     Paternal uncle    Review of Systems: See HPI Remaining ROS negative.  Physical Exam: There were no vitals taken for this visit. Wt Readings from Last 3 Encounters:  12/29/15 203 lb 5 oz (92.222 kg)  09/26/15 203 lb (92.08 kg)  05/05/08 216 lb 4 oz (98.09 kg)     General appearance: well nourished [place] man HENNT: Pharynx no erythema, exudate, mass, or ulcer. No thyromegaly or thyroid nodules Lymph nodes: No cervical, supraclavicular, or axillary lymphadenopathy Breasts:  Lungs: Clear to auscultation, resonant to percussion throughout Heart: Regular rhythm, no murmur, no gallop, no rub, no click, no edema Abdomen: Soft, nontender, normal bowel sounds, no mass, no organomegaly Extremities: Hands warm, sweaty,No edema, no calf tenderness.  Musculoskeletal: no joint deformities GU:no inguinal adenopathy Vascular: Carotid pulses 2+, no bruits, distal pulses: Dorsalis pedis 2+ symmetric. Feet are slightly cool to the touch but acyanotic excellent pulses. Neurologic: Alert, oriented, PERRLA, optic discs sharp and vessels normal, no hemorrhage or exudate, cranial nerves grossly normal, motor strength 5 over 5, reflexes 1+ symmetric, upper body coordination normal, gait normal,sensation intact to vibration over the finger tips and dorsa of the feet to tuning fork exam Skin: No rash or ecchymosis    Lab Results: Lab Results  Component Value Date   WBC 5.9 12/29/2015   HGB 17.5* 12/29/2015   HCT 53.1* 12/29/2015   MCV 89.6 12/29/2015   PLT 230.0 12/29/2015     Chemistry      Component Value Date/Time   NA 140 12/29/2015 1158   K 4.1 12/29/2015 1158   CL 105 12/29/2015 1158   CO2 30 12/29/2015 1158   BUN 20  12/29/2015 1158   CREATININE 1.10 12/29/2015 1158      Component Value Date/Time   CALCIUM 9.4 12/29/2015 1158   ALKPHOS 64 12/29/2015 1158   AST 21 12/29/2015 1158   ALT 44 12/29/2015 1158   BILITOT 0.5 12/29/2015 1158      Review of peripheral blood film:  Normochromic normocytic red cells. No spherocytes, schistocytes, or inclusions. Mature neutrophils and lymphocytes. Platelets normal in number and morphology.   Radiological Studies: No results found.   Impression : Atypical symptoms of fluctuating hot and cold sensations of the lower extremities and erythromelalgia of the feet. Hemoglobin and hematocrit borderline elevated. No leukocytosis or thrombocytosis No organomegaly  I don't think we have enough evidence at this point in time to say that he has polycythemia vera. He has no reasons to have secondary polycythemia. He is a never smoker. Normal oxygen saturation per his history. No clinical suspicion of obstructive sleep apnea. On no medication that would cause hemoconcentration. Not on androgens. No suspicion for underlying malignancy. Two thirds of the patients with polycythemia vera also have elevations of white count and platelet count which he does not. Symptoms  suggest some form of sympathetic nerve dysfunction.  Plan:  I'm going to go ahead and check a erythropoietin level and a JAK-2 gene analysis. 85% of patients with P vera will have a mutation in the V617F allele. Another 10% can be picked up by doing additional gene studies on the exon 12 allele.  To be complete, one could also check an oxygen P 50 curve to rule out a high oxygen affinity hemoglobinopathy.         Annia Belt, MD 01/15/2016, 4:30 PM

## 2016-01-15 NOTE — Patient Instructions (Signed)
To lab today Return as needed 

## 2016-01-16 ENCOUNTER — Encounter: Payer: 59 | Admitting: Neurology

## 2016-01-16 ENCOUNTER — Encounter: Payer: Self-pay | Admitting: Oncology

## 2016-01-16 DIAGNOSIS — D751 Secondary polycythemia: Secondary | ICD-10-CM

## 2016-01-16 DIAGNOSIS — R208 Other disturbances of skin sensation: Secondary | ICD-10-CM | POA: Insufficient documentation

## 2016-01-16 HISTORY — DX: Other disturbances of skin sensation: R20.8

## 2016-01-16 HISTORY — DX: Secondary polycythemia: D75.1

## 2016-01-20 LAB — CBC WITH DIFFERENTIAL/PLATELET
Basophils Absolute: 0 10*3/uL (ref 0.0–0.2)
Basos: 1 %
EOS (ABSOLUTE): 0.1 10*3/uL (ref 0.0–0.4)
EOS: 1 %
HEMATOCRIT: 50.6 % (ref 37.5–51.0)
HEMOGLOBIN: 17.1 g/dL (ref 12.6–17.7)
IMMATURE GRANS (ABS): 0 10*3/uL (ref 0.0–0.1)
Immature Granulocytes: 0 %
LYMPHS ABS: 2 10*3/uL (ref 0.7–3.1)
LYMPHS: 31 %
MCH: 30 pg (ref 26.6–33.0)
MCHC: 33.8 g/dL (ref 31.5–35.7)
MCV: 89 fL (ref 79–97)
MONOCYTES: 8 %
Monocytes Absolute: 0.6 10*3/uL (ref 0.1–0.9)
NEUTROS ABS: 3.9 10*3/uL (ref 1.4–7.0)
Neutrophils: 59 %
Platelets: 273 10*3/uL (ref 150–379)
RBC: 5.7 x10E6/uL (ref 4.14–5.80)
RDW: 13 % (ref 12.3–15.4)
WBC: 6.6 10*3/uL (ref 3.4–10.8)

## 2016-01-20 LAB — JAK2 GENOTYPR

## 2016-01-20 LAB — ERYTHROPOIETIN: ERYTHROPOIETIN: 8.9 m[IU]/mL (ref 2.6–18.5)

## 2016-04-03 MED FILL — METFORMIN HCL ER 500 MG TAB: 500 | 90 days supply | Qty: 90 | Fill #2

## 2016-05-15 ENCOUNTER — Ambulatory Visit (INDEPENDENT_AMBULATORY_CARE_PROVIDER_SITE_OTHER): Payer: 59 | Admitting: Internal Medicine

## 2016-05-15 ENCOUNTER — Encounter: Payer: Self-pay | Admitting: Internal Medicine

## 2016-05-15 VITALS — BP 118/82 | HR 96 | Ht 72.0 in | Wt 206.0 lb

## 2016-05-15 DIAGNOSIS — R7303 Prediabetes: Secondary | ICD-10-CM | POA: Diagnosis not present

## 2016-05-15 DIAGNOSIS — E0789 Other specified disorders of thyroid: Secondary | ICD-10-CM | POA: Diagnosis not present

## 2016-05-15 DIAGNOSIS — R208 Other disturbances of skin sensation: Secondary | ICD-10-CM | POA: Diagnosis not present

## 2016-05-15 LAB — POCT GLYCOSYLATED HEMOGLOBIN (HGB A1C): Hemoglobin A1C: 5.7

## 2016-05-15 MED ORDER — MICROLET LANCETS MISC: Status: DC

## 2016-05-15 MED ORDER — GLUCOSE BLOOD VI STRP: ORAL_STRIP | Status: DC

## 2016-05-15 NOTE — Patient Instructions (Signed)
Your HbA1c today was 5.7%.  Continue Metformin XR 500 mg with dinner.  Increase ALA to 600 mg 2x a day.  Let me know about the correct Lyme test.  We will need to check: - thyroid antibodies - testosterone - lipids - Lyme tests - MTHFR gene mutation   If the tests are negative, we will need: - NCT (possibly off alpha lipoic acid) - Sleep study    Please come back for a follow-up appointment in 6 months.

## 2016-05-15 NOTE — Progress Notes (Addendum)
Patient ID: Dustin [name], male   DOB: [birth year], 50 y.o.   MRN: [id number]  HPI: Dustin [name] is a 50 y.o.-year-old male, self- referred for management of prediabetes, dx in [id number], without complications and also for peripheral neuropathy, unclear if related to prediabetes.  Prediabetes: Last hemoglobin A1c was: 08/2015: 6.1% Previously, 5.8% << 5.7%  Pt is on a regimen of: - Metformin ER 500 mg daily with dinner  Pt does not check his sugars at home - he does not have a meter.  Pt's meals are: - Breakfast: scrambled eggs every day every second week, 1x a week bisquit, oatmeal - Lunch: veggies soup, chicken + veggies - Dinner: 2 roti's whole wheat bread + green veggies with [name] (not a lot of oil), home made yoghurt, brown rice or quinoa or millet (1 cup) - Snacks: no sweets  - no CKD, last BUN/creatinine:  Lab Results  Component Value Date   BUN 20 12/29/2015   CREATININE 1.10 12/29/2015   - last set of lipids - will need to obtain from PCP No results found for: CHOL, HDL, LDLCALC, LDLDIRECT, TRIG, CHOLHDL - last eye exam was in 2016. No DR.   Pt has FH of DM in father, P aunt and P uncle  Peripheral neuropathy: + migratory dysesthesia   He describes burning in heel for years, then whole sole started burning in 07/2015 >> 30% better now He also has cold sensation thighs to waist, back  - become generalized >> 30% better now  Patient has had extensive investigation for this - by PCP, 2 neurologists and a hematologist >> findings: - a low-normal thiamine level: 8 (8-30). He started thiamine 100 mg and felt that his symptoms are better.  - a low vitamin D, at 7, which then increased to 23 on ~2500 units daily vitamin D supplementation. - A low normal vitamin B12, which is now supplementing with 5000 g B12. He is also taking an MVI daily. - Normal lipid profile except low HDL in the past. He does have a history of fatty liver. - Normal TFTs repeatedly  - Normal  copper, zinc - normal heavy metal screen - normal CRP - normal SPEP/UPEP - normal celiac tests - No anemia, in fact, his hemoglobin was slightly higher and he was referred to Hematology. JAK2 mutation negative. - He did not have NCTs  He also snores, but oxygen saturation normal.   ROS: Constitutional: no weight gain/loss, + fatigue, no subjective hyperthermia/hypothermia Eyes: no blurry vision, no xerophthalmia ENT: no sore throat, no nodules palpated in throat, no dysphagia/odynophagia, no hoarseness Cardiovascular: no CP/SOB/palpitations/leg swelling Respiratory: no cough/SOB Gastrointestinal: no N/V/D/C Musculoskeletal: no muscle/joint aches Skin: no rashes Neurological: no tremors/numbness/tingling/dizziness, + see HPI Psychiatric: no depression/anxiety  Past Medical History  Diagnosis Date  . Vision abnormalities   . Impaired glucose tolerance   . Vitamin deficiency   . 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 Dysesthesia of multiple sites 01/16/2016    Fluctuating Hot & cold sensation primarily legs, sometimes back or saddle area since 11/16   Past Surgical History  Procedure Laterality Date  . Tonsillectomy     Social History   Social History Main Topics  . Smoking status: Never Smoker   . Smokeless tobacco: Never Used  . Alcohol Use: 0.0 oz/week    0 Standard drinks or equivalent per week     Comment: occasionally, 1-2 scotch 3-4 times monthly  . Drug Use: No   Social History Narrative   Lives  with wife and 2 children (16 and 10 - in 2017) in a 2 story home.     Works as a Engineer, drilling for [employer].   Current Outpatient Prescriptions on File Prior to Visit  Medication Sig Dispense Refill  . cholecalciferol (VITAMIN D) 1000 UNITS tablet Take 5,000 Units by mouth every other day.    . cyanocobalamin 1000 MCG tablet Take 5,000 mcg by mouth every other day.    . metFORMIN (GLUCOPHAGE-XR) 500 MG 24 hr tablet   6  . multivitamin (METANX) 3-35-2 MG TABS tablet Take  1-2 tablets by mouth daily. (Patient taking differently: Take 1-2 tablets by mouth 2 (two) times daily. ) 60 tablet 12  . gabapentin (NEURONTIN) 100 MG capsule Take 1 tablet at bedtime for 3 days, then increase to 2 tablet at bedtime as needed. (Patient not taking: Reported on 05/15/2016) 60 capsule 5   No current facility-administered medications on file prior to visit.   No Known Allergies Family History  Problem Relation Age of Onset  . Other Mother     other  . Diabetes type II Father   . Coronary artery disease Father   . Healthy Brother   . Healthy Son   . Healthy Daughter   . Diabetes Other     Paternal aunt  . CAD Other     Paternal uncle    PE: BP 118/82 mmHg  Pulse 96  Ht 6' (1.829 m)  Wt 206 lb (93.441 kg)  BMI 27.93 kg/m2  SpO2 96% Wt Readings from Last 3 Encounters:  05/15/16 206 lb (93.441 kg)  12/29/15 203 lb 5 oz (92.222 kg)  09/26/15 203 lb (92.08 kg)   Constitutional: overweight, in NAD Eyes: PERRLA, EOMI, no exophthalmos ENT: moist mucous membranes, no thyromegaly, no cervical lymphadenopathy Cardiovascular: RRR, No MRG Respiratory: CTA B Gastrointestinal: abdomen soft, NT, ND, BS+ Musculoskeletal: no deformities, strength intact in all 4 Skin: moist, warm, no rashes Neurological: no tremor with outstretched hands, DTR normal in all 4 Foot exam performed today: normal.  ASSESSMENT: 1. Prediabetes  2. Peripheral neuropathy  3. Left Thyroid lobe fullness  PLAN:  1. Patient with a history of prediabetes and extensive family history of diabetes. He is taking metformin extended-release 500 mg daily, with dinner, which we will continue. He is not checking sugars at home, but he was given a meter today and advised to check if she feels poorly. He does describe intermittent episodes of extreme fatigue, without the pattern, and definitely not if he delays a meal. He also has occasional irritability after dinner. I did advise him to check his sugars in the  situations. Given a glucometer. -  we checked his HbA1c today, 5.7% - advised for yearly eye exams >> he is getting an exam yearly, last in 2016 - foot exam today >> normal - Return to clinic in 6 mo with sugar log   2. Peripheral neuropathy - Had improved approximately 30% since symptoms started, but bothersome for the patient, especially since he has had extensive investigation without finding a cause.  - the sxs started with heel burning, however, in the last 6-8 months, the burning sensation has extended to the whole sole (bilaterally) and also has fleeting dysesthesia (cold sensation) in his thighs and upper body, but not in extremities. - We reviewed together his previous consults with hematology and neurology and also the results of these investigations. Positive findings are: A very low vitamin D (now on vitamin D supplement and level  has improved), a low normal B1 and B12  levels  (now on supplements), slightly high hemoglobin (which then improved to normal), and a low HDL (which has improved reportedly - patient will send me these labs). - At this visit, we decided to also look into:  Testing for Lyme disease - pt prefers to d/w ID about the best test for this and will order after he lets me know about his preference.  Testing his thyroid antibodies: TPO and ATA  Testing for a mutation in the MTHFR gene  Checking an 8 AM, fasting, testosterone level  We may also add a recheck of his vitamin D, lipid panel, and possibly a homocysteine  Check a ferritin level if nor previously checked - I recommended that he started alpha lipoic acid, which he did, but he is now at 300 mg twice a day, and I advised him to increase to 600 mg twice a day - If he is negative for the MTHFR mutation, we will also start generic Metanx - If the investigation above is negative, I suggested that he had a sleep study (? If mild nocturnal hypoxia could be related to his slightly higher hemoglobin levels). He  also desires to have a nerve conduction study, which he originally had ordered, but he canceled after he started feeling a little better after starting vitamin supplementation  3. Left Thyroid lobe fullness - Patient has a far left thyroid lobe on palpation - TFTs normal 3 months ago - Will check a thyroid ultrasound  Component     Latest Ref Rng & Units 05/30/2016  Cholesterol     0 - 200 mg/dL 185  Triglycerides     0.0 - 149.0 mg/dL 151.0 (H)  HDL Cholesterol     >39.00 mg/dL 43.00  VLDL     0.0 - 40.0 mg/dL 30.2  LDL (calc)     0 - 99 mg/dL 111 (H)  Total CHOL/HDL Ratio      4  NonHDL      141.64  Testosterone     250 - 827 ng/dL 424  Albumin     3.6 - 5.1 g/dL 4.4  Sex Horm Binding Glob, Serum     10 - 50 nmol/L 28  Testosterone Free     47.0 - 244.0 pg/mL 64.3  Testosterone, Bioavailable     130.5 - 681.7 ng/dL 129.5 (L)  Vitamin D 1, 25 (OH) Total     18 - 72 pg/mL 43  Vitamin D3 1, 25 (OH)     pg/mL 43  Vitamin D2 1, 25 (OH)     pg/mL <8  MTHFR      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 I suggested that patient started methylated B vitamins. This could be contributing to his fatty liver, fatigue, dysesthesia. Thyroid tests, testosterone, vitamin D, vitamin B12, Lyme serology all negative.  Triglycerides and LDL cholesterol slightly high.  EXAM: THYROID ULTRASOUND TECHNIQUE: Ultrasound examination of the thyroid  gland and adjacent soft tissues was performed. COMPARISON:  None. FINDINGS: Right thyroid lobe Measurements: 3.7 x 1.2 x 1.5 cm. Incidental note is made of a  small hypoechoic nodule in the upper gland which measures 0.6 x 0.4 x 0.6 cm. Small hypoechoic nodule in the medial mid gland measures 0.5 x 0.3 x 0.4 cm. Left thyroid lobe Measurements: 3.9 x 1.1 x 1.4 cm.  No nodules visualized. Isthmus Thickness: 0.2 cm.  No nodules visualized. Lymphadenopathy Prominent bilateral cervical lymph nodes. The fatty hila remain preserved. No definitive enlargement by short axis measurements. IMPRESSION: 1. Incidental note is made of two small subcentimeter nodules in the right thyroid gland. Findings do not meet current SRU consensus criteria for biopsy. Follow-up by clinical exam is recommended. If patient has known risk factors for thyroid carcinoma, consider follow-up ultrasound in 12 months. If patient is clinically hyperthyroid, consider nuclear medicine thyroid uptake and scan. Reference: Management of Thyroid Nodules Detected at [place]: Society of Radiologists in [place]. Radiology 2005; [id number]. 2. Prominent but not definitively enlarged bilateral cervical chain lymph nodes. Electronically Signed   By: Jacqulynn Cadet M.D.   On: 05/21/2016 15:35  Small thyroid nodules, no intervention necessary.  - time spent with the patient: 1 hour, of which >50% was spent in obtaining information about his symptoms, reviewing his previous labs, evaluations, and treatments, counseling him about his conditions (please see the discussed topics above), and developing a plan to further investigate and treat them; he had a number of questions which I addressed.   Philemon Kingdom, MD PhD [facility] Endocrinology

## 2016-05-20 ENCOUNTER — Telehealth: Payer: Self-pay

## 2016-05-20 NOTE — Telephone Encounter (Signed)
Called and spoke with patient. He asked about changing his site for ultrasound. Advised patient we changed site, but he needed to call and cancel the old one. Patient stated he would, no other questions or concerns.

## 2016-05-21 ENCOUNTER — Ambulatory Visit (HOSPITAL_COMMUNITY)
Admission: RE | Admit: 2016-05-21 | Discharge: 2016-05-21 | Disposition: A | Discharge status: Home / Self Care | Payer: 59 | Source: Ambulatory Visit | Attending: Internal Medicine | Admitting: Internal Medicine

## 2016-05-21 ENCOUNTER — Encounter: Payer: Self-pay | Admitting: Neurology

## 2016-05-21 ENCOUNTER — Other Ambulatory Visit: Payer: 59

## 2016-05-21 DIAGNOSIS — E0789 Other specified disorders of thyroid: Secondary | ICD-10-CM

## 2016-05-21 DIAGNOSIS — E042 Nontoxic multinodular goiter: Secondary | ICD-10-CM | POA: Insufficient documentation

## 2016-05-21 DIAGNOSIS — R221 Localized swelling, mass and lump, neck: Secondary | ICD-10-CM | POA: Diagnosis not present

## 2016-05-21 NOTE — Telephone Encounter (Signed)
Please see mychart message from patient.

## 2016-05-23 ENCOUNTER — Encounter: Payer: Self-pay | Admitting: Internal Medicine

## 2016-05-23 ENCOUNTER — Other Ambulatory Visit: Payer: Self-pay | Admitting: Internal Medicine

## 2016-05-23 DIAGNOSIS — R208 Other disturbances of skin sensation: Secondary | ICD-10-CM

## 2016-05-23 DIAGNOSIS — R5383 Other fatigue: Secondary | ICD-10-CM

## 2016-05-23 NOTE — Progress Notes (Signed)
Msg from pt: Labs up to 04/2016:    12/29/15   SPEP/UPEP: No monoclonal free light chains (Bence Jones Protein) are detected. Urine IFE shows polyclonal increase in free and or free Lambda light chains   IFE: IgG 1030 [phone]), IgA 310 (68-379), IgM 16 (41-251)   Heavy Metal Screen (Arsenic, Mercury, Lead): neg   IgA Reticulin Ab: neg, IgA TTG: Neg, IgG Deaminated Gliadin: neg, IgA Gliadin: Neg   Cerruloplasmin: Normal   Zinc: Normal   Vitamin B6: 11.1 (2.1-21.7)   Vitamin B1: 8 (8-30)   ESR: 2 (0-22)   10/05/2015   Insulin: 22.1 (2.6-24.9)   CRP: 0.4 (0.0-4.9)   Aldolase: 4.9 (3.3-10.3)   ANA: Negative   CK: 73 (24-204)   Rheumatoid Factor: <10 (0.0-13.9)   ESR: 2 (0-15)   08/10/2015   Vitamin B12: 1372 (211-946)   TSH: 2.630 (0.450-4.5)   Vitamin D 25: 28 (30-100)   01/06/2015   CRP: 0.4 (0.0-4.9)   ESR: 2 (0-15)   TSH: 2.210 (0.450-4.5)   Urine microscopy: Neg   07/13/2013   Vitamin B12: 407 (211-946)

## 2016-05-27 ENCOUNTER — Telehealth: Payer: Self-pay

## 2016-05-27 NOTE — Telephone Encounter (Signed)
Spoke with Dr. Elsworth Soho, he said that he did speak to patient (Dr. Algis Liming) and Dr. Ashby Dawes.  He said that Dr. Mathis Fare office needs to order the Home Sleep Test and he would read it.   Called and gave information to [place] (Dr. Mathis Fare nurse) She will send order for Home Sleep Study. Nothing further needed.

## 2016-05-27 NOTE — Telephone Encounter (Signed)
lmtcb X1 for [employer] at [facility]

## 2016-05-29 ENCOUNTER — Ambulatory Visit (INDEPENDENT_AMBULATORY_CARE_PROVIDER_SITE_OTHER): Payer: 59 | Admitting: Allergy and Immunology

## 2016-05-29 ENCOUNTER — Encounter: Payer: Self-pay | Admitting: Allergy and Immunology

## 2016-05-29 VITALS — BP 116/78 | HR 92 | Temp 98.0°F | Resp 18 | Ht 71.5 in | Wt 211.4 lb

## 2016-05-29 DIAGNOSIS — G629 Polyneuropathy, unspecified: Secondary | ICD-10-CM

## 2016-05-29 DIAGNOSIS — R197 Diarrhea, unspecified: Secondary | ICD-10-CM

## 2016-05-29 MED ORDER — COLESTIPOL HCL 1 G PO TABS: ORAL_TABLET | ORAL | 2 refills | Status: DC

## 2016-05-29 NOTE — Telephone Encounter (Signed)
Received order from Dr. Mathis Fare office.  Spoke with [facility] about it.  Sending message to [facility] per [facility].  [facility] has order and will discuss with Lanny Hurst.

## 2016-05-29 NOTE — Progress Notes (Signed)
Dear Dr. Ashby [name],  Thank you for referring Dustin [name] to the [facility] on 05/29/2016.   Below is a summation of this patient's evaluation and recommendations.  Thank you for your referral. I will keep you informed about this patient's response to treatment.   If you have any questions please to do hesitate to contact me.   Sincerely,  Dustin Prows, MD [facility]   ______________________________________________________________________    NEW PATIENT NOTE  Referring Provider: Merrilee Seashore, MD Primary Provider: Merrilee Seashore, MD Date of office visit: 05/29/2016    Subjective:   Chief Complaint:  Dustin [name] (DOB: [birth year]) is a 50 y.o. male with a chief complaint of No chief complaint on file.  who presents to the clinic on 05/29/2016 with the following problems:  HPI: Dustin [name] presents to this clinic in evaluation of 2 issues.  First, he has been having a change in bowel habits over the course of the past 2 years. He has urgency with very soft to watery stools 4 times per day. He's had this evaluated by Dr. Cristina [name] who performed an endoscopy and colonoscopy at the very beginning of the symptom onset and apparently everything appeared to be normal. In addition, he sometimes gets intermittent esophageal dysmotility averaging out about 1 time per month to 2 times per week that is very transient and only appears to occur with the first or second bite of eating followed by resolution. It is possible that these issues started soon after returning from [place] in 2015 although he is not entirely sure about the temporal relationship between that visit and the onset of his GI issue.  Second, he's been having what appears to be a neuropathy that has been evaluated by a neurologist with extensive evaluation including heavy metal check. He has burning on the soles of his  feet and then he has a "hot/cold "sensation on his thighs and buttocks and occasional lower back. All these issues are intermittent in nature and happen a few times per week. They have not been progressive over the course of the past 8 months or so. Apparently he has had a documented hemoglobin A1c at 6.1% and was placed on metformin with normalization of his hemoglobin A1c during this timeframe.  Past Medical History:  Diagnosis Date  . Dysesthesia of multiple sites 01/16/2016   Fluctuating Hot & cold sensation primarily legs, sometimes back or saddle area since 11/16  . Impaired glucose tolerance   . 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 Vision abnormalities   . Vitamin deficiency     Past Surgical History:  Procedure Laterality Date  . TONSILLECTOMY        Medication List      cholecalciferol 1000 units tablet Commonly known as:  VITAMIN D Take 5,000 Units by mouth every other day.   cyanocobalamin 1000 MCG tablet Take 5,000 mcg by mouth every other day.   glucose blood test strip Commonly known as:  BAYER CONTOUR NEXT TEST Use 1x a day   metFORMIN 500 MG 24 hr tablet Commonly known as:  GLUCOPHAGE-XR   MICROLET LANCETS Misc Use 1x a day   multivitamin 3-35-2 MG Tabs tablet Take 1-2 tablets by mouth daily.       No Known Allergies  Review of systems negative except as noted in HPI / PMHx or noted below:  Review of Systems  Constitutional: Negative.   HENT: Negative.  Eyes: Negative.   Respiratory: Negative.   Cardiovascular: Negative.   Gastrointestinal: Negative.   Genitourinary: Negative.   Musculoskeletal: Negative.   Skin: Negative.   Neurological: Negative.   Endo/Heme/Allergies: Negative.   Psychiatric/Behavioral: Negative.     Family History  Problem Relation Age of Onset  . Other Mother     other  . Diabetes type II Father   . Coronary artery disease Father   . Healthy Brother   . Healthy Son   . Healthy Daughter   . Diabetes Other      Paternal aunt  . CAD Other     Paternal uncle    Social History   Social History  . Marital status: Married    Spouse name: N/A  . Number of children: N/A  . Years of education: N/A   Occupational History  . Not on file.   Social History Main Topics  . Smoking status: Never Smoker  . Smokeless tobacco: Never Used  . Alcohol use 0.0 oz/week     Comment: occasionally, 1-2 scotch 3-4 times monthly  . Drug use: No  . Sexual activity: Not on file   Other Topics Concern  . Not on file   Social History Narrative   Lives with wife and 2 children in a 2 story home.     Works as a Engineer, drilling for [employer].    Environmental and Social history  Lives in a house with a dry environment, no animals located inside the household, carpeting in the bedroom, no plastic on the better pillow, no smoking ongoing with inside the household, and employment as a hospitalist at [employer]   Objective:   Vitals:   05/29/16 0856  BP: 116/78  Pulse: 92  Resp: 18  Temp: 98 F (36.7 C)   Height: 5' 11.5" (181.6 cm) Weight: 211 lb 6.4 oz (95.9 kg)  Physical Exam  Constitutional: He is well-developed, well-nourished, and in no distress.  HENT:  Head: Normocephalic. Head is without right periorbital erythema and without left periorbital erythema.  Right Ear: Tympanic membrane, external ear and ear canal normal.  Left Ear: Tympanic membrane, external ear and ear canal normal.  Nose: Nose normal. No mucosal edema or rhinorrhea.  Mouth/Throat: Uvula is midline, oropharynx is clear and moist and mucous membranes are normal. No oropharyngeal exudate.  Eyes: Conjunctivae and lids are normal. Pupils are equal, round, and reactive to light.  Neck: Trachea normal. No tracheal tenderness present. No tracheal deviation present. No thyromegaly present.  Cardiovascular: Normal rate, regular rhythm, S1 normal, S2 normal and normal heart sounds.   No murmur heard. Pulmonary/Chest: Effort normal  and breath sounds normal. No stridor. No tachypnea. No respiratory distress. He has no wheezes. He has no rales. He exhibits no tenderness.  Abdominal: Soft. He exhibits no distension and no mass. There is no hepatosplenomegaly. There is no tenderness. There is no rebound and no guarding.  Musculoskeletal: He exhibits no edema or tenderness.  Lymphadenopathy:       Head (right side): No tonsillar adenopathy present.       Head (left side): No tonsillar adenopathy present.    He has no cervical adenopathy.    He has no axillary adenopathy.  Neurological: He is alert. Gait normal.  Skin: No rash noted. He is not diaphoretic. No erythema. No pallor. Nails show no clubbing.  Psychiatric: Mood and affect normal.     Diagnostics: Allergy skin tests were performed. He did not demonstrate any hypersensitivity  against a screen panel of foods.  Review of review of blood tests obtained 01/15/2016 identified a white blood cell count of 6.6 with a normal differential, hemoglobin of 17.1 with an MCV of 89, and a platelet count of 273. A jak 2 mutation analysis was negative. His erythropoietin level was 8.9 m international units/mL.   He had a negative SPEP and urine protein electrophoresis obtained 12/29/2015. IgG was 1030, IgA 310, IgM 16 MG/DL. Vitamin B1, B6, zinc, and ceruloplasmin were all normal. Heavy metal screen was negative.   Assessment and Plan:    1. Diarrhea, unspecified type   2. Neuropathy ([place])     1. Allergen avoidance measures?  2. Review results from colonoscopy and upper endoscopy including biopsies  3. Helicobacter pylori urease breath test  4. Giardia antigen stool test, stool O&P  5. ANA w/reflex, ANCA w/reflex, VIP serum level  6. Treatment? - Colestipol 1 g one tablet 1-2 times a day  7. Further evaluation?  Nolen obviously has some type of GI dysfunction that is poorly defined without a specific etiologic factor. We will look for possible infectious forms of  diarrhea especially given the fact that he has had a trip to [place] sometime prior to the onset of this issue and as well we'll check him for possible vasculitic syndrome that may be contributing not just to his GI is but may be contributing to  his neurological issue as well. Concerning therapy, the only suggestion I had was to try a bile salt binding resin to see if this does help with his diarrhea. I'll contact him with the results of his blood tests once they are available for review and will make a determination about how to proceed pending those results.  Dustin Prows, MD [facility]

## 2016-05-29 NOTE — Patient Instructions (Signed)
  1. Allergen avoidance measures?  2. Review results from colonoscopy and upper endoscopy including biopsies  3. Helicobacter pylori urease breath test  4. Giardia antigen stool test, stool O&P, ANA w/reflex, ANCA w/reflex, VIP serum level  5. Treatment? - Colestipol 1 g one tablet 1-2 times a day  6. Further evaluation?

## 2016-05-29 NOTE — Telephone Encounter (Signed)
Order and records faxed to [facility] for the Home study to be done there they will cann dr ramachandran's nurse nance and have her to fill out a form for this study and then order for dr Elsworth Soho to read when done Joellen Jersey

## 2016-05-30 ENCOUNTER — Other Ambulatory Visit: Payer: Self-pay | Admitting: Internal Medicine

## 2016-05-30 ENCOUNTER — Other Ambulatory Visit: Payer: Self-pay | Admitting: Allergy and Immunology

## 2016-05-30 ENCOUNTER — Other Ambulatory Visit (INDEPENDENT_AMBULATORY_CARE_PROVIDER_SITE_OTHER): Payer: 59

## 2016-05-30 DIAGNOSIS — R197 Diarrhea, unspecified: Secondary | ICD-10-CM | POA: Diagnosis not present

## 2016-05-30 DIAGNOSIS — R208 Other disturbances of skin sensation: Secondary | ICD-10-CM

## 2016-05-30 DIAGNOSIS — R5383 Other fatigue: Secondary | ICD-10-CM

## 2016-05-30 LAB — VITAMIN B12: Vitamin B-12: 373 pg/mL (ref 211–911)

## 2016-05-30 LAB — LIPID PANEL
Cholesterol: 185 mg/dL (ref 0–200)
HDL: 43 mg/dL (ref >39.00)
LDL Cholesterol: 111 mg/dL — ABNORMAL HIGH (ref 0–99)
NONHDL: 141.64
Total CHOL/HDL Ratio: 4
Triglycerides: 151 mg/dL — ABNORMAL HIGH (ref 0.0–149.0)
VLDL: 30.2 mg/dL (ref 0.0–40.0)

## 2016-05-30 LAB — VITAMIN D 25 HYDROXY (VIT D DEFICIENCY, FRACTURES): VITD: 39.04 ng/mL (ref 30.00–100.00)

## 2016-05-30 LAB — FERRITIN: FERRITIN: 53.8 ng/mL (ref 22.0–322.0)

## 2016-05-31 ENCOUNTER — Other Ambulatory Visit: Payer: Self-pay | Admitting: Allergy and Immunology

## 2016-05-31 DIAGNOSIS — R197 Diarrhea, unspecified: Secondary | ICD-10-CM | POA: Diagnosis not present

## 2016-05-31 LAB — THYROGLOBULIN ANTIBODY: Thyroglobulin Ab: <1 IU/mL (ref <2)

## 2016-05-31 LAB — TESTOSTERONE TOTAL,FREE,BIO, MALES
Albumin: 4.4 g/dL (ref 3.6–5.1)
Sex Hormone Binding: 28 nmol/L (ref 10–50)
TESTOSTERONE BIOAVAILABLE: 129.5 ng/dL — AB (ref 130.5–681.7)
Testosterone, Free: 64.3 pg/mL (ref 47.0–244.0)
Testosterone: 424 ng/dL (ref 250–827)

## 2016-05-31 LAB — H. PYLORI BREATH TEST: H. PYLORI BREATH TEST: NOT DETECTED

## 2016-05-31 LAB — HOMOCYSTEINE: HOMOCYSTEINE: 12 umol/L — AB (ref <11.4)

## 2016-05-31 LAB — THYROID PEROXIDASE ANTIBODY: THYROID PEROXIDASE ANTIBODY: 3 [IU]/mL (ref <9)

## 2016-05-31 LAB — LYME AB/WESTERN BLOT REFLEX: B burgdorferi Ab IgG+IgM: <0.9 Index (ref <0.90)

## 2016-06-02 DIAGNOSIS — S62522A Displaced fracture of distal phalanx of left thumb, initial encounter for closed fracture: Secondary | ICD-10-CM | POA: Diagnosis not present

## 2016-06-02 DIAGNOSIS — S62502B Fracture of unspecified phalanx of left thumb, initial encounter for open fracture: Secondary | ICD-10-CM | POA: Diagnosis not present

## 2016-06-02 LAB — VITAMIN D 1,25 DIHYDROXY
VITAMIN D 1, 25 (OH) TOTAL: 43 pg/mL (ref 18–72)
VITAMIN D3 1, 25 (OH): 43 pg/mL

## 2016-06-03 DIAGNOSIS — M79645 Pain in left finger(s): Secondary | ICD-10-CM | POA: Diagnosis not present

## 2016-06-03 DIAGNOSIS — S62522B Displaced fracture of distal phalanx of left thumb, initial encounter for open fracture: Secondary | ICD-10-CM | POA: Diagnosis not present

## 2016-06-03 LAB — OVA AND PARASITE EXAMINATION: OP: NONE SEEN

## 2016-06-03 MED FILL — CIPROFLOXACIN HCL 500 MG TA: 500 | 14 days supply | Qty: 28 | Fill #0

## 2016-06-03 MED FILL — HYDROCODON-APAP 5-325: 5-325 | 3 days supply | Qty: 40 | Fill #0

## 2016-06-05 ENCOUNTER — Other Ambulatory Visit: Payer: Self-pay | Admitting: Internal Medicine

## 2016-06-05 DIAGNOSIS — S62522D Displaced fracture of distal phalanx of left thumb, subsequent encounter for fracture with routine healing: Secondary | ICD-10-CM | POA: Diagnosis not present

## 2016-06-05 LAB — GIARDIA ANTIGEN

## 2016-06-07 LAB — MTHFR DNA ANALYSIS

## 2016-06-10 ENCOUNTER — Encounter: Payer: Self-pay | Admitting: Internal Medicine

## 2016-06-10 DIAGNOSIS — M79645 Pain in left finger(s): Secondary | ICD-10-CM | POA: Diagnosis not present

## 2016-06-10 DIAGNOSIS — S62522D Displaced fracture of distal phalanx of left thumb, subsequent encounter for fracture with routine healing: Secondary | ICD-10-CM | POA: Diagnosis not present

## 2016-06-11 ENCOUNTER — Encounter (HOSPITAL_BASED_OUTPATIENT_CLINIC_OR_DEPARTMENT_OTHER): Payer: Self-pay

## 2016-06-13 ENCOUNTER — Ambulatory Visit (INDEPENDENT_AMBULATORY_CARE_PROVIDER_SITE_OTHER): Payer: 59 | Admitting: Neurology

## 2016-06-13 DIAGNOSIS — R202 Paresthesia of skin: Secondary | ICD-10-CM

## 2016-06-13 DIAGNOSIS — G609 Hereditary and idiopathic neuropathy, unspecified: Secondary | ICD-10-CM

## 2016-06-13 DIAGNOSIS — R209 Unspecified disturbances of skin sensation: Secondary | ICD-10-CM

## 2016-06-13 NOTE — Procedures (Signed)
[facility]  [address]  [place], [place] [address] Tel: [phone] Fax:  [phone] Test Date:  06/13/2016  Patient: Dustin [name] DOB: Dec 30, 1965 Physician: Narda Amber, DO  Sex: Male Height: 6\' 1"  Ref Phys: Narda Amber, DO  ID#: [id number] Temp: 33.3C Technician: Jerilynn Mages. Dean   Patient Complaints: This is a 50 year old gentleman referred for evaluation of transient bilateral feet paresthesias and migratory dysesthesias involving the lower legs.  NCV & EMG Findings: Extensive electrodiagnostic testing of the right lower extremity and additional studies of the left shows: 1. Bilateral sural and superficial peroneal sensory responses are within normal limits. 2. Bilateral tibial and peroneal motor responses are within normal limits. 3. Bilateral tibial H reflex studies are within normal limits. 4. There is no evidence of active or chronic motor axon loss changes affecting any of the tested muscles. Motor unit configuration and recruitment pattern is within normal limits.  Impression: This is a normal study of the lower extremities. In particular, there is no evidence of a generalized sensorimotor polyneuropathy or lumbosacral radiculopathy.   ___________________________ Narda Amber, DO    Nerve Conduction Studies Anti Sensory Summary Table   Site NR Peak (ms) Norm Peak (ms) P-T Amp (V) Norm P-T Amp  Left Sup Peroneal Anti Sensory (Ant Lat Mall)  33.3C  12 cm    3.5 <4.5 9.8 >5  Right Sup Peroneal Anti Sensory (Ant Lat Mall)  12 cm    3.4 <4.5 8.2 >5  Left Sural Anti Sensory (Lat Mall)  33.3C  Calf    3.8 <4.5 14.7 >5  Right Sural Anti Sensory (Lat Mall)  Calf    4.0 <4.5 9.9 >5   Motor Summary Table   Site NR Onset (ms) Norm Onset (ms) O-P Amp (mV) Norm O-P Amp Site1 Site2 Delta-0 (ms) Dist (cm) Vel (m/s) Norm Vel (m/s)  Left Peroneal Motor (Ext Dig Brev)  33.3C  Ankle    3.5 <5.5 7.2 >3 B Fib Ankle 7.9 35.0 44 >40  B Fib    11.4  6.4   Poplt B Fib 2.0 10.0 50 >40  Poplt    13.4  6.4         Right Peroneal Motor (Ext Dig Brev)  33.3C  Ankle    3.4 <5.5 4.2 >3 B Fib Ankle 7.9 34.0 43 >40  B Fib    11.3  3.7  Poplt B Fib 2.2 10.0 45 >40  Poplt    13.5  3.6         Left Tibial Motor (Abd Hall Brev)  33.3C  Ankle    4.1 <6.0 13.6 >8 Knee Ankle 9.0 44.0 49 >40  Knee    13.1  9.6         Right Tibial Motor (Abd Hall Brev)  33.3C  Ankle    5.5 <6.0 14.0 >8 Knee Ankle 9.2 44.0 48 >40  Knee    14.7  8.6          H Reflex Studies   NR H-Lat (ms) Lat Norm (ms) L-R H-Lat (ms) M-Lat (ms) HLat-MLat (ms)  Left Tibial (Gastroc)  33.3C     34.83 <35 0.54 5.03 29.80  Right Tibial (Gastroc)     34.29 <35 0.54 5.03 29.26   EMG   Side Muscle Ins Act Fibs Psw Fasc Number Recrt Dur Dur. Amp Amp. Poly Poly. Comment  Left AntTibialis Nml Nml Nml Nml Nml Nml Nml Nml Nml Nml Nml Nml N/A  Left  Gastroc Nml Nml Nml Nml Nml Nml Nml Nml Nml Nml Nml Nml N/A  Left Flex Dig Long Nml Nml Nml Nml Nml Nml Nml Nml Nml Nml Nml Nml N/A  Left RectFemoris Nml Nml Nml Nml Nml Nml Nml Nml Nml Nml Nml Nml N/A  Left BicepsFemS Nml Nml Nml Nml Nml Nml Nml Nml Nml Nml Nml Nml N/A  Left GluteusMed Nml Nml Nml Nml Nml Nml Nml Nml Nml Nml Nml Nml N/A  Left Lumbo Parasp Low Nml Nml Nml Nml Nml Nml Nml Nml Nml Nml Nml Nml N/A  Right AntTibialis Nml Nml Nml Nml Nml Nml Nml Nml Nml Nml Nml Nml N/A  Right Gastroc Nml Nml Nml Nml Nml Nml Nml Nml Nml Nml Nml Nml N/A  Right Flex Dig Long Nml Nml Nml Nml Nml Nml Nml Nml Nml Nml Nml Nml N/A  Right RectFemoris Nml Nml Nml Nml Nml Nml Nml Nml Nml Nml Nml Nml N/A  Right GluteusMed Nml Nml Nml Nml Nml Nml Nml Nml Nml Nml Nml Nml N/A      Waveforms:

## 2016-06-17 DIAGNOSIS — S62522D Displaced fracture of distal phalanx of left thumb, subsequent encounter for fracture with routine healing: Secondary | ICD-10-CM | POA: Diagnosis not present

## 2016-07-03 DIAGNOSIS — S62522D Displaced fracture of distal phalanx of left thumb, subsequent encounter for fracture with routine healing: Secondary | ICD-10-CM | POA: Diagnosis not present

## 2016-07-09 ENCOUNTER — Ambulatory Visit (HOSPITAL_BASED_OUTPATIENT_CLINIC_OR_DEPARTMENT_OTHER): Payer: 59

## 2016-07-10 ENCOUNTER — Ambulatory Visit (HOSPITAL_BASED_OUTPATIENT_CLINIC_OR_DEPARTMENT_OTHER): Payer: 59 | Attending: Internal Medicine | Admitting: Pulmonary Disease

## 2016-07-10 VITALS — Ht 73.0 in | Wt 204.0 lb

## 2016-07-10 DIAGNOSIS — G478 Other sleep disorders: Secondary | ICD-10-CM | POA: Insufficient documentation

## 2016-07-10 DIAGNOSIS — G4733 Obstructive sleep apnea (adult) (pediatric): Secondary | ICD-10-CM | POA: Insufficient documentation

## 2016-07-10 DIAGNOSIS — R0683 Snoring: Secondary | ICD-10-CM | POA: Diagnosis not present

## 2016-07-15 ENCOUNTER — Other Ambulatory Visit (HOSPITAL_BASED_OUTPATIENT_CLINIC_OR_DEPARTMENT_OTHER): Payer: Self-pay

## 2016-07-15 DIAGNOSIS — G478 Other sleep disorders: Secondary | ICD-10-CM

## 2016-07-16 DIAGNOSIS — S62522D Displaced fracture of distal phalanx of left thumb, subsequent encounter for fracture with routine healing: Secondary | ICD-10-CM | POA: Diagnosis not present

## 2016-07-17 ENCOUNTER — Encounter: Payer: Self-pay | Admitting: Internal Medicine

## 2016-07-17 DIAGNOSIS — G4733 Obstructive sleep apnea (adult) (pediatric): Secondary | ICD-10-CM | POA: Diagnosis not present

## 2016-07-17 NOTE — Procedures (Signed)
Patient Name: Dustin [name], Dustin [name] Date: 07/10/2016 Gender: Male D.O.B: 1966/04/18 Age (years): 50 Referring Provider: Merrilee Seashore MD Height (inches): 73 Interpreting Physician: Kara Mead MD, ABSM Weight (lbs): 204 RPSGT: Jacolyn Reedy BMI: 27 MRN: [id number] Neck Size: 16.50   CLINICAL INFORMATION Sleep Study Type: HST Indication for sleep study:snoring, non refreshing sleep Epworth Sleepiness Score: 7   SLEEP STUDY TECHNIQUE A multi-channel overnight portable sleep study was performed. The channels recorded were: nasal airflow, thoracic respiratory movement, and oxygen saturation with a pulse oximetry. Snoring was also monitored.   SLEEP ARCHITECTURE Patient was studied for 429.5 minutes. The sleep efficiency was 97.8 % and the patient was supine for 64.7%. The arousal index was 0.0 per hour. RESPIRATORY PARAMETERS The overall AHI was 8.7 per hour, with a central apnea index of 0.0 per hour. The oxygen nadir was 86% during sleep.   CARDIAC DATA Mean heart rate during sleep was 71.8 bpm.   IMPRESSIONS - Mild obstructive sleep apnea occurred during this study (AHI = 8.7/h). - No significant central sleep apnea occurred during this study (CAI = 0.0/h). - Moderate oxygen desaturation was noted during this study (Min O2 = 86%). - Patient snored 2.0% during the sleep.   DIAGNOSIS - Obstructive Sleep Apnea (327.23 [G47.33 ICD-10])   RECOMMENDATIONS - Positional therapy avoiding supine position during sleep. - Avoid alcohol, sedatives and other CNS depressants that may worsen sleep apnea and disrupt normal sleep architecture. - Sleep hygiene should be reviewed to assess factors that may improve sleep quality. - Weight management and regular exercise should be initiated or continued. - Return to Sleep Center to discuss the results of this study - The cardiovascular consequences of this degree of sleep disorder breathing is debatable. If he is very  symptomatic, treatment options include positional therapy alone or combined with CPAP therapy or oral appliance     Kara Mead MD Board certified in [place]

## 2016-07-19 DIAGNOSIS — S62522D Displaced fracture of distal phalanx of left thumb, subsequent encounter for fracture with routine healing: Secondary | ICD-10-CM | POA: Diagnosis not present

## 2016-07-26 DIAGNOSIS — M79645 Pain in left finger(s): Secondary | ICD-10-CM | POA: Diagnosis not present

## 2016-07-30 DIAGNOSIS — M79645 Pain in left finger(s): Secondary | ICD-10-CM | POA: Diagnosis not present

## 2016-08-06 DIAGNOSIS — S62522D Displaced fracture of distal phalanx of left thumb, subsequent encounter for fracture with routine healing: Secondary | ICD-10-CM | POA: Diagnosis not present

## 2016-08-09 DIAGNOSIS — M79645 Pain in left finger(s): Secondary | ICD-10-CM | POA: Diagnosis not present

## 2016-08-27 ENCOUNTER — Telehealth: Payer: Self-pay

## 2016-08-27 ENCOUNTER — Telehealth: Payer: Self-pay | Admitting: Internal Medicine

## 2016-08-27 NOTE — Telephone Encounter (Signed)
Called and spoke with patient. He states the information he is needing was sent by regular mail, which we have not had time to go get at the post office yet. I will contact patient and see if he can scan it to my email or Dr.Gherghe's email, and we will fill out and fax and mail back.   Called patient back, he will scan the paperwork to my email and copy Dr.Gherghe into it as well.

## 2016-08-27 NOTE — Telephone Encounter (Signed)
Work paperwork is requesting medical records to be sent did we receive this paperwork?

## 2016-11-18 DIAGNOSIS — M25512 Pain in left shoulder: Secondary | ICD-10-CM | POA: Diagnosis not present

## 2016-11-18 DIAGNOSIS — M25511 Pain in right shoulder: Secondary | ICD-10-CM | POA: Diagnosis not present

## 2016-11-18 DIAGNOSIS — M79645 Pain in left finger(s): Secondary | ICD-10-CM | POA: Diagnosis not present

## 2016-11-18 DIAGNOSIS — S62522D Displaced fracture of distal phalanx of left thumb, subsequent encounter for fracture with routine healing: Secondary | ICD-10-CM | POA: Diagnosis not present

## 2016-11-25 MED FILL — PROMETHAZINE VC SYRUP: 6.25-5 | 14 days supply | Qty: 210 | Fill #0

## 2016-11-25 MED FILL — FLUTICASONE PROP 50 MCG SPR: 50 | 30 days supply | Qty: 16 | Fill #0

## 2017-02-06 ENCOUNTER — Ambulatory Visit (INDEPENDENT_AMBULATORY_CARE_PROVIDER_SITE_OTHER): Payer: 59 | Admitting: Family Medicine

## 2017-02-06 ENCOUNTER — Encounter: Payer: Self-pay | Admitting: Family Medicine

## 2017-02-06 DIAGNOSIS — R7303 Prediabetes: Secondary | ICD-10-CM

## 2017-02-06 NOTE — Progress Notes (Signed)
Medical Nutrition Therapy:  Appt start time: 1630 end time:  1730.  Assessment:  Primary concerns today: Weight management, Blood sugar control and neuropathies of uncertain etiology.   Learning Readiness: Change in progress  Usual eating pattern includes 3 meals and 0-1 snack per day. Frequent foods and beverages include water, black tea w/ 1 tsp sugar&2%milk, 1 c coffee w/ 1 t crm, green (& other) veg's, lentils, beans, tofu (~1 X wk), brown rice, naan, (some quinoa), eggs ~1-2 X wk, oatmeal, homemade yogurt.  Avoided foods include meat, fish (has meat maybe 1 X wk).   Current physical activity includes aerobic ex and weight training 30 min 3 X wk and walking 5 X wk.  Tends to go in phases with exercise.   24-hr recall: (Up at 6:15 AM) B (6:50 AM)-   2-3 c patted rice, 1 c tea, 1 tsp sugar, 2% milk Snk ( AM)-   water L (12:30 PM)-  2 slc quesadilla with cheese & spinach, 2 c steamed veg's, water Snk ( PM)-  water D (7:30 PM)-  2 rotis (tortilla), 1 c brown rice, mixed veg's, 2 c lentils, water Snk ( PM)-   Typical day? Yes.    Progress Towards Goal(s):  In progress.   Nutritional Diagnosis:  NB-1.1 Food and nutrition-related knowledge deficit As related to glycemic control.  As evidenced by expressed interest in learning how to better manage BG and weight.    Intervention:  Nutritional education  Handouts given during visit include:  AVS  Demonstrated degree of understanding via:  Teach Back  Barriers to learning/adherence to lifestyle change: Vegetarian diet will make it a bit harder to limit carbohydrate while obtaining adequate protein.  Monitoring/Evaluation:  Dietary intake, exercise, FBG, and body weight prn.  Patient will call if he has Qs.

## 2017-02-06 NOTE — Patient Instructions (Addendum)
-   You may want to explore:  [place] for diabetic neuropathies.     Diet Recommendations for Diabetes  Starchy (carb) foods: Bread, rice, pasta, potatoes, corn, cereal, grits, crackers, bagels, muffins, all baked goods.  (Fruits, milk, and yogurt also have carbohydrate, but most of these foods will not spike your blood sugar as most starchy foods will.)  A few fruits do cause high blood sugars; use small portions of bananas (limit to 1/2 at a time), grapes, watermelon, oranges, and most tropical fruits.    Protein foods: Meat, fish, poultry, eggs, dairy foods, and beans such as pinto and kidney beans (beans also provide carbohydrate).   1. Eat at least 3 meals and 1-2 snacks per day. Never go more than 4-5 hours while awake without eating. Eat breakfast within the first hour of getting up.   2. Limit starchy foods to TWO per meal and ONE per snack. ONE portion of a starchy  food is equal to the following:   - ONE slice of bread (or its equivalent, such as half of a hamburger bun).   - 1/2 cup of a "scoopable" starchy food such as potatoes or rice.   - 15 grams of Total Carbohydrate as shown on food label.  3. Include at every meal: a protein food, a carb food, and vegetables (and/or fruit).   - Obtain twice the volume of veg's as protein or carbohydrate foods for both lunch and dinner.   - Fresh or frozen veg's are best.   - Keep frozen veg's on hand for a quick vegetable serving.      FOR YOU: - Continue using about the same amount of beans and lentils; use a bit more soy protein sources (to get more protein without the carb).  Perhaps also use some eggs and dairy a bit more liberally. - The lowest-glycemic carb's will usually be those with the most fiber.  Fiber helps slow digestion (as does fat, and to some degree, protein), so less sugar gets to your blood stream right away.  - Continue to check FBG at least once a week.  (More often would be better for you to determine how you are  responding to any dietary changes).    - You can find nutritional contents of foods at https://www.lee.info/.   - Qs:  Email [email or web address].

## 2017-03-10 ENCOUNTER — Other Ambulatory Visit: Payer: Self-pay

## 2017-03-10 ENCOUNTER — Encounter: Payer: Self-pay | Admitting: Internal Medicine

## 2017-03-10 MED ORDER — METFORMIN HCL ER 500 MG PO TB24: ORAL_TABLET | ORAL | 2 refills | Status: DC

## 2017-03-10 MED FILL — METFORMIN HCL ER 500 MG TAB: 500 | 90 days supply | Qty: 90 | Fill #0

## 2017-04-14 DIAGNOSIS — R1011 Right upper quadrant pain: Secondary | ICD-10-CM | POA: Diagnosis not present

## 2017-04-14 DIAGNOSIS — K760 Fatty (change of) liver, not elsewhere classified: Secondary | ICD-10-CM | POA: Diagnosis not present

## 2017-04-14 DIAGNOSIS — R740 Nonspecific elevation of levels of transaminase and lactic acid dehydrogenase [LDH]: Secondary | ICD-10-CM | POA: Diagnosis not present

## 2017-04-15 DIAGNOSIS — K760 Fatty (change of) liver, not elsewhere classified: Secondary | ICD-10-CM | POA: Diagnosis not present

## 2017-04-15 MED FILL — PANTOPRAZOLE SOD DR 40 MG T: 40 | 90 days supply | Qty: 180 | Fill #0

## 2017-05-19 ENCOUNTER — Other Ambulatory Visit (HOSPITAL_COMMUNITY): Payer: Self-pay | Admitting: Gastroenterology

## 2017-05-19 ENCOUNTER — Ambulatory Visit (HOSPITAL_COMMUNITY)
Admission: RE | Admit: 2017-05-19 | Discharge: 2017-05-19 | Disposition: A | Discharge status: Home / Self Care | Payer: 59 | Source: Ambulatory Visit | Attending: Gastroenterology | Admitting: Gastroenterology

## 2017-05-19 DIAGNOSIS — K769 Liver disease, unspecified: Secondary | ICD-10-CM | POA: Insufficient documentation

## 2017-05-19 DIAGNOSIS — K824 Cholesterolosis of gallbladder: Secondary | ICD-10-CM | POA: Insufficient documentation

## 2017-05-19 DIAGNOSIS — K829 Disease of gallbladder, unspecified: Secondary | ICD-10-CM | POA: Insufficient documentation

## 2017-05-19 DIAGNOSIS — R1011 Right upper quadrant pain: Secondary | ICD-10-CM

## 2017-05-19 MED FILL — URSODIOL 300 MG CAPSULE: 300 | 30 days supply | Qty: 60 | Fill #0

## 2017-06-12 ENCOUNTER — Other Ambulatory Visit: Payer: Self-pay | Admitting: Internal Medicine

## 2017-06-12 MED FILL — CONTOUR NEXT STRIPS: 50 days supply | Qty: 50 | Fill #0

## 2017-06-17 DIAGNOSIS — K760 Fatty (change of) liver, not elsewhere classified: Secondary | ICD-10-CM | POA: Diagnosis not present

## 2017-06-17 DIAGNOSIS — R740 Nonspecific elevation of levels of transaminase and lactic acid dehydrogenase [LDH]: Secondary | ICD-10-CM | POA: Diagnosis not present

## 2017-06-17 DIAGNOSIS — R1011 Right upper quadrant pain: Secondary | ICD-10-CM | POA: Diagnosis not present

## 2017-09-30 DIAGNOSIS — H524 Presbyopia: Secondary | ICD-10-CM | POA: Diagnosis not present

## 2017-09-30 DIAGNOSIS — H52203 Unspecified astigmatism, bilateral: Secondary | ICD-10-CM | POA: Diagnosis not present

## 2017-09-30 DIAGNOSIS — H5203 Hypermetropia, bilateral: Secondary | ICD-10-CM | POA: Diagnosis not present

## 2017-10-01 MED FILL — METFORMIN HCL ER 500 MG TAB: 500 | 90 days supply | Qty: 90 | Fill #1

## 2017-11-26 ENCOUNTER — Ambulatory Visit: Payer: 59 | Admitting: Internal Medicine

## 2017-12-09 ENCOUNTER — Encounter: Payer: Self-pay | Admitting: Internal Medicine

## 2017-12-09 ENCOUNTER — Ambulatory Visit: Payer: 59 | Admitting: Internal Medicine

## 2017-12-09 VITALS — BP 122/74 | HR 84 | Ht 72.0 in | Wt 207.0 lb

## 2017-12-09 DIAGNOSIS — G6289 Other specified polyneuropathies: Secondary | ICD-10-CM

## 2017-12-09 DIAGNOSIS — R7303 Prediabetes: Secondary | ICD-10-CM | POA: Diagnosis not present

## 2017-12-09 DIAGNOSIS — E0789 Other specified disorders of thyroid: Secondary | ICD-10-CM

## 2017-12-09 DIAGNOSIS — G629 Polyneuropathy, unspecified: Secondary | ICD-10-CM | POA: Insufficient documentation

## 2017-12-09 DIAGNOSIS — E785 Hyperlipidemia, unspecified: Secondary | ICD-10-CM | POA: Insufficient documentation

## 2017-12-09 LAB — POCT GLYCOSYLATED HEMOGLOBIN (HGB A1C): HEMOGLOBIN A1C: 6.2

## 2017-12-09 MED ORDER — NONFORMULARY OR COMPOUNDED ITEM: 2 refills | Status: DC

## 2017-12-09 MED ORDER — METFORMIN HCL ER 500 MG PO TB24: ORAL_TABLET | ORAL | 3 refills | Status: DC

## 2017-12-09 NOTE — Patient Instructions (Signed)
Please start the neuropathy cream 2x a day.  Continue Metformin ER 500 mg with dinner.  Please come back for a follow-up appointment in 6 months.

## 2017-12-09 NOTE — Progress Notes (Signed)
Patient ID: Dustin [name], male   DOB: [birth year], 52 y.o.   MRN: [id number]  HPI: Dustin [name] is a 52 y.o.-year-old male, returning for follow-up for prediabetes, dx in 76/8115, without complications and also for peripheral neuropathy, unclear if related to prediabetes.  Last visit 1 year and 7 months ago.  Prediabetes: Last hemoglobin A1c was: Lab Results  Component Value Date   HGBA1C 5.7 05/15/2016  08/2015: 6.1% Previously, 5.8% << 5.7%  Pt is on a regimen of: - Metformin ER 500 mg daily with dinner >> misses this ~1x a week  He does not check sugars at home but just got an AccuChek guide meter >> we showed him how to start  Pt's meals are: - Breakfast: scrambled eggs every day every second week, 1x a week bisquit, oatmeal - Lunch: veggies soup, chicken + veggies - Dinner: 2 roti's whole wheat bread + green veggies with Dustin [name] (not a lot of oil), home made yoghurt, brown rice or quinoa or millet (1 cup) - Snacks: no sweets  - No CKD, last BUN/creatinine:  Lab Results  Component Value Date   BUN 20 12/29/2015   CREATININE 1.10 12/29/2015   -+ HL; last set of lipids: Lab Results  Component Value Date   CHOL 185 05/30/2016   HDL 43.00 05/30/2016   LDLCALC 111 (H) 05/30/2016   TRIG 151.0 (H) 05/30/2016   CHOLHDL 4 05/30/2016  He is not on a statin. - last eye exam was 09/2017: No DR. Dr. Satira Sark.  Pt has FH of DM in father, paternal aunt and paternal uncle.  Peripheral neuropathy: Previopus Migratory dysesthesia >> now more localized to feet + lower legs  He describes burning in heels for years, then the whole sole started burning in 07/2015.  This has improved over the last year, but has occasional flare ups >> sensitivity, cold intolerance, increased sweating - in feet and lower legs.  In the past, he also had a cold sensation in thighs to waist and to his back that can become generalized.  This has resolved.  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 I suggested that patient started methylated B vitamins. This could be contributing to his fatty liver,  fatigue, dysesthesia. Thyroid tests, testosterone, vitamin D, vitamin B12, Lyme serology all negative.   - Per PCP, 2 neurologists, hematologist >> findings: - a low-normal thiamine level: 8 (8-30). He started thiamine 100 mg and felt that his symptoms are better.  - a low vitamin D, at 7, which then increased to 23 on ~2500 units daily vitamin D supplementation. - A low normal vitamin B12, which is now supplementing with 5000 g B12. He is also taking an MVI daily. - Normal lipid profile except low HDL in the past. He does have a history of fatty liver. - Normal TFTs repeatedly  - Normal copper, zinc - normal heavy metal screen - normal CRP - normal SPEP/UPEP - normal celiac tests - No anemia, in fact, his hemoglobin was slightly higher and he was referred to Hematology. JAK2 mutation negative. - He did not have NCTs  Msg from pt: Labs up to 04/2016:   12/29/15   SPEP/UPEP: No monoclonal free light chains  (Bence Jones Protein) are detected. Urine IFE shows polyclonal increase in free and or free Lambda light chains   IFE: IgG 1030 ([phone]), IgA 310 (68-379), IgM 16 (41-251)   Heavy Metal Screen (Arsenic, Mercury, Lead): neg   IgA Reticulin Ab: neg, IgA TTG: Neg, IgG Deaminated Gliadin: neg, IgA Gliadin: Neg   Cerruloplasmin: Normal   Zinc: Normal   Vitamin B6: 11.1 (2.1-21.7)   Vitamin B1: 8 (8-30)   ESR: 2 (0-22)   10/05/2015   Insulin: 22.1 (2.6-24.9)   CRP: 0.4 (0.0-4.9)   Aldolase: 4.9 (3.3-10.3)   ANA: Negative   CK: 73 (24-204)   Rheumatoid Factor: <10 (0.0-13.9)   ESR: 2 (0-15)   08/10/2015   Vitamin B12: 1372 (211-946)   TSH: 2.630 (0.450-4.5)   Vitamin D 25: 28 (30-100)   01/06/2015   CRP: 0.4 (0.0-4.9)   ESR: 2 (0-15)   TSH: 2.210 (0.450-4.5)   Urine microscopy: Neg   07/13/2013  Vitamin B12: 407 (211-946)  ROS: Constitutional: + weight loss, no fatigue, no subjective hyperthermia, no subjective hypothermia Eyes: no blurry vision, no xerophthalmia ENT: no sore throat, no nodules palpated in throat, no dysphagia, no odynophagia, no hoarseness Cardiovascular: no CP/no SOB/no palpitations/no leg swelling Respiratory: no cough/no SOB/no wheezing Gastrointestinal: no N/no V/no D/no C/no acid reflux Musculoskeletal: no muscle aches/no joint aches Skin: no rashes, no hair loss Neurological: no tremors/no numbness/no tingling/no dizziness  I reviewed pt's medications, allergies, PMH, social hx, family hx, and changes were documented in the history of present illness. Otherwise, unchanged from my initial visit note.  Past Medical History:  Diagnosis Date  . Dysesthesia of multiple sites 01/16/2016   Fluctuating Hot & cold sensation primarily legs, sometimes back or saddle area since 11/16  . Impaired glucose tolerance   . 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 Vision abnormalities   . Vitamin deficiency    Past Surgical History:  Procedure  Laterality Date  . TONSILLECTOMY     Social History   Social History Main Topics  . Smoking status: Never Smoker   . Smokeless tobacco: Never Used  . Alcohol Use: 0.0 oz/week    0 Standard drinks or equivalent per week     Comment: occasionally, 1-2 scotch 3-4 times monthly  . Drug Use: No   Social History Narrative   Lives with wife and 2 children (55 and 10 - in 2017) in a 2 story home.     Works as a Engineer, drilling for [employer].   Current  Outpatient Medications on File Prior to Visit  Medication Sig Dispense Refill  . cholecalciferol (VITAMIN D) 1000 UNITS tablet Take 5,000 Units by mouth every other day.    . CONTOUR NEXT TEST test strip USE TO TEST ONCE A DAY 100 each 3  . cyanocobalamin 1000 MCG tablet Take 5,000 mcg by mouth every other day.    . metFORMIN (GLUCOPHAGE-XR) 500 MG 24 hr tablet Take one tablet daily at dinner. 90 tablet 2  . MICROLET LANCETS MISC Use 1x a day 100 each 3  . multivitamin (METANX) 3-35-2 MG TABS tablet Take 1-2 tablets by mouth daily. 60 tablet 12   No current facility-administered medications on file prior to visit.    No Known Allergies Family History  Problem Relation Age of Onset  . Other Mother        other  . Diabetes type II Father   . Coronary artery disease Father   . Healthy Brother   . Healthy Son   . Healthy Daughter   . Diabetes Other        Paternal aunt  . CAD Other        Paternal uncle    PE: BP 122/74 (BP Location: Left Arm, Patient Position: Sitting, Cuff Size: Large)   Pulse 84   Ht 6' (1.829 m)   Wt 207 lb (93.9 kg)   BMI 28.07 kg/m  Wt Readings from Last 3 Encounters:  12/09/17 207 lb (93.9 kg)  02/06/17 210 lb 12.8 oz (95.6 kg)  07/10/16 204 lb (92.5 kg)   Constitutional: Slightly overweight, in NAD Eyes: PERRLA, EOMI, no exophthalmos ENT: moist mucous membranes, no thyromegaly, no cervical lymphadenopathy Cardiovascular: RRR, No MRG Respiratory: CTA B Gastrointestinal: abdomen soft, NT, ND,  BS+ Musculoskeletal: no deformities, strength intact in all 4 Skin: moist, warm, no rashes Neurological: no tremor with outstretched hands, DTR normal in all 4  ASSESSMENT: 1. Prediabetes  2. Peripheral neuropathy  3. Left Thyroid lobe fullness Thyroid ultrasound (04/2016): 1. Incidental note is made of two small subcentimeter nodules in the right thyroid gland. Findings do not meet current SRU consensus criteria for biopsy. Follow-up by clinical exam is recommended.  2. Prominent but not definitively enlarged bilateral cervical chain lymph nodes.  3.  Hyperlipidemia  PLAN:  1. Patient with history of prediabetes and extensive family history of diabetes.  He continues on metformin ER 500 mg daily with dinner, with occasional skipped doses.  - today, HbA1c is 6.2% (higher) - He is not checking sugars at home, but has an Accu-Chek guide meter >> will start to check once a day - Discussed about the importance of diet >> he is interested in a plant-based diet: Given references. - We also discussed that he needs to start an exercise routine.  He works very long hours so it is difficult to fit this into his schedule. - advised for yearly eye exams >> he is UTD - Return to clinic in 6 mo with sugar log    2. Peripheral neuropathy - Improved since last visit-unclear reason.  It is possible the starting vitamins helped.  He has used alpha lipoic acid in 2017, without significant effect, so he stopped after 6 months.  He now has occasional exacerbations, usually with cold weather but these are milder, with only dysesthesia on the bottoms of his feet and around the ankles.  This is significant, though, so that his wife notices that he has the discomfort and also he cannot use certain shoes  and socks during an episode. -  He will restart taking his vitamins  - we also discussed about the advantages and disadvantages of taking Neurontin, Lyrica, Cymbalta - He is interested in taking something as  needed, only when he has an exacerbation.  I suggested a compounded neuropathy cream >> he agrees to try this: Sent to the pharmacy: Amitriptyline 2%, Lidocaine 2%, and Ketamine 1% - apply on feet 2x a day  3. Left Thyroid lobe fullness -Patient has a full left thyroid on palpation -TFTs were normal -Thyroid ultrasound in 2017 showed very small nodules, not worrisome  3.  Hyperlipidemia - We reviewed his most recent lipid panel from 2017: LDL 111 - We discussed about whether to start statins or not.  We also discussed about risk adjusters based on the new hyperlipidemia guidelines (coronary calcium score, Apo B, lipoprotein (a), possibly ABIs).  He is interested in getting some of these tests done.  He has a lipid panel coming up at his annual physical exam with PCP next month.  - time spent with the patient: 30 min, of which >50% was spent in obtaining information about his symptoms, reviewing his previous labs, evaluations, and treatments, counseling him about his conditions (please see the discussed topics above), and developing a plan to further investigate and treat them; he had a number of questions which I addressed.    Philemon Kingdom, MD PhD [facility] Endocrinology

## 2017-12-11 DIAGNOSIS — R9439 Abnormal result of other cardiovascular function study: Secondary | ICD-10-CM | POA: Insufficient documentation

## 2017-12-11 MED FILL — METOPROLOL TARTRATE 25 MG T: 25 | 30 days supply | Qty: 60 | Fill #0

## 2017-12-12 ENCOUNTER — Other Ambulatory Visit: Payer: Self-pay | Admitting: Cardiology

## 2017-12-12 DIAGNOSIS — R9439 Abnormal result of other cardiovascular function study: Secondary | ICD-10-CM

## 2017-12-12 DIAGNOSIS — E785 Hyperlipidemia, unspecified: Secondary | ICD-10-CM | POA: Diagnosis not present

## 2017-12-12 DIAGNOSIS — R079 Chest pain, unspecified: Secondary | ICD-10-CM | POA: Diagnosis not present

## 2017-12-12 DIAGNOSIS — R7303 Prediabetes: Secondary | ICD-10-CM | POA: Diagnosis not present

## 2017-12-15 ENCOUNTER — Ambulatory Visit (HOSPITAL_COMMUNITY)
Admission: RE | Admit: 2017-12-15 | Discharge: 2017-12-15 | Disposition: A | Discharge status: Home / Self Care | Payer: 59 | Source: Ambulatory Visit | Attending: Cardiology | Admitting: Cardiology

## 2017-12-15 ENCOUNTER — Other Ambulatory Visit: Payer: Self-pay | Admitting: Cardiology

## 2017-12-15 DIAGNOSIS — I25119 Atherosclerotic heart disease of native coronary artery with unspecified angina pectoris: Secondary | ICD-10-CM | POA: Diagnosis not present

## 2017-12-15 DIAGNOSIS — I209 Angina pectoris, unspecified: Secondary | ICD-10-CM | POA: Diagnosis present

## 2017-12-15 DIAGNOSIS — R9439 Abnormal result of other cardiovascular function study: Secondary | ICD-10-CM

## 2017-12-15 DIAGNOSIS — R079 Chest pain, unspecified: Secondary | ICD-10-CM | POA: Diagnosis not present

## 2017-12-15 MED ORDER — METOPROLOL TARTRATE 5 MG/5ML IV SOLN
5.0000 mg | INTRAVENOUS | Status: DC | PRN
  Administered 2017-12-15 (×2): 5 mg via INTRAVENOUS
  Filled 2017-12-15 (×2): qty 5

## 2017-12-15 MED ORDER — METOPROLOL TARTRATE 5 MG/5ML IV SOLN
INTRAVENOUS | Status: AC
  Administered 2017-12-15: 5 mg via INTRAVENOUS
  Filled 2017-12-15: qty 10

## 2017-12-15 MED ORDER — NITROGLYCERIN 0.4 MG SL SUBL
0.8000 mg | SUBLINGUAL_TABLET | Freq: Once | SUBLINGUAL | Status: AC
  Administered 2017-12-15: 0.8 mg via SUBLINGUAL
  Filled 2017-12-15: qty 25

## 2017-12-15 MED ORDER — IOPAMIDOL (ISOVUE-370) INJECTION 76%
INTRAVENOUS | Status: AC
  Administered 2017-12-15: 80 mL via INTRAVENOUS
  Filled 2017-12-15: qty 100

## 2017-12-15 MED ORDER — NITROGLYCERIN 0.4 MG SL SUBL
SUBLINGUAL_TABLET | SUBLINGUAL | Status: AC
  Administered 2017-12-15: 0.8 mg via SUBLINGUAL
  Filled 2017-12-15: qty 2

## 2017-12-15 MED FILL — ROSUVASTATIN CALCIUM 20 MG: 20 | 90 days supply | Qty: 90 | Fill #0

## 2017-12-23 DIAGNOSIS — R9439 Abnormal result of other cardiovascular function study: Secondary | ICD-10-CM | POA: Diagnosis not present

## 2017-12-23 DIAGNOSIS — R079 Chest pain, unspecified: Secondary | ICD-10-CM | POA: Diagnosis not present

## 2017-12-29 DIAGNOSIS — E7800 Pure hypercholesterolemia, unspecified: Secondary | ICD-10-CM | POA: Diagnosis not present

## 2017-12-29 DIAGNOSIS — I25118 Atherosclerotic heart disease of native coronary artery with other forms of angina pectoris: Secondary | ICD-10-CM | POA: Diagnosis not present

## 2017-12-29 DIAGNOSIS — I209 Angina pectoris, unspecified: Secondary | ICD-10-CM | POA: Diagnosis not present

## 2017-12-29 DIAGNOSIS — R7303 Prediabetes: Secondary | ICD-10-CM | POA: Diagnosis not present

## 2017-12-30 DIAGNOSIS — E785 Hyperlipidemia, unspecified: Secondary | ICD-10-CM | POA: Diagnosis not present

## 2017-12-30 MED FILL — PANTOPRAZOLE SOD DR 40 MG T: 40 | 30 days supply | Qty: 30 | Fill #0

## 2017-12-30 MED FILL — NITROGLYCERIN 0.4 MG TAB SL: 0.4 | 25 days supply | Qty: 25 | Fill #0

## 2017-12-30 MED FILL — ASPIRIN LOW DOSE 81 MG CHEW: 81 | 90 days supply | Qty: 90 | Fill #0

## 2018-01-04 MED FILL — METOPROLOL TARTRATE 25 MG T: 25 | 30 days supply | Qty: 60 | Fill #0

## 2018-01-05 MED FILL — NIACIN ER (ANTIHYPERLIPIDEM: 500 | 90 days supply | Qty: 90 | Fill #0

## 2018-01-23 MED FILL — METFORMIN HCL ER 500 MG TAB: 500 | 90 days supply | Qty: 90 | Fill #2

## 2018-02-09 MED FILL — METOPROLOL TARTRATE 25 MG T: 25 | 30 days supply | Qty: 60 | Fill #0

## 2018-02-10 DIAGNOSIS — E7800 Pure hypercholesterolemia, unspecified: Secondary | ICD-10-CM | POA: Diagnosis not present

## 2018-02-10 DIAGNOSIS — I209 Angina pectoris, unspecified: Secondary | ICD-10-CM | POA: Diagnosis not present

## 2018-02-17 ENCOUNTER — Ambulatory Visit: Payer: 59 | Admitting: Internal Medicine

## 2018-02-17 DIAGNOSIS — R7303 Prediabetes: Secondary | ICD-10-CM | POA: Diagnosis not present

## 2018-02-17 DIAGNOSIS — I209 Angina pectoris, unspecified: Secondary | ICD-10-CM | POA: Diagnosis not present

## 2018-02-17 DIAGNOSIS — E7800 Pure hypercholesterolemia, unspecified: Secondary | ICD-10-CM | POA: Diagnosis not present

## 2018-02-17 DIAGNOSIS — I25118 Atherosclerotic heart disease of native coronary artery with other forms of angina pectoris: Secondary | ICD-10-CM | POA: Diagnosis not present

## 2018-03-09 DIAGNOSIS — Z0000 Encounter for general adult medical examination without abnormal findings: Secondary | ICD-10-CM | POA: Diagnosis not present

## 2018-03-09 DIAGNOSIS — I2510 Atherosclerotic heart disease of native coronary artery without angina pectoris: Secondary | ICD-10-CM | POA: Diagnosis not present

## 2018-03-09 DIAGNOSIS — R7309 Other abnormal glucose: Secondary | ICD-10-CM | POA: Diagnosis not present

## 2018-03-09 DIAGNOSIS — E782 Mixed hyperlipidemia: Secondary | ICD-10-CM | POA: Diagnosis not present

## 2018-03-12 MED FILL — CYCLOBENZAPRINE 10 MG TAB: 10 | 7 days supply | Qty: 20 | Fill #0

## 2018-03-12 MED FILL — ROSUVASTATIN CALCIUM 20 MG: 20 | 90 days supply | Qty: 90 | Fill #0

## 2018-03-12 MED FILL — METOPROLOL TARTRATE 25 MG T: 25 | 90 days supply | Qty: 180 | Fill #0

## 2018-03-12 MED FILL — METHYLPREDNISOLONE 4 MG TAB: 4 | 6 days supply | Qty: 21 | Fill #0

## 2018-03-19 MED FILL — MELOXICAM 15 MG TABLET: 15 | 30 days supply | Qty: 30 | Fill #0

## 2018-04-06 ENCOUNTER — Other Ambulatory Visit (HOSPITAL_COMMUNITY): Payer: Self-pay | Admitting: Neurosurgery

## 2018-04-06 ENCOUNTER — Other Ambulatory Visit: Payer: Self-pay | Admitting: Neurosurgery

## 2018-04-06 DIAGNOSIS — M5412 Radiculopathy, cervical region: Secondary | ICD-10-CM

## 2018-04-07 ENCOUNTER — Encounter (HOSPITAL_COMMUNITY): Payer: Self-pay

## 2018-04-07 ENCOUNTER — Ambulatory Visit
Admission: RE | Admit: 2018-04-07 | Discharge: 2018-04-07 | Disposition: A | Discharge status: Home / Self Care | Payer: 59 | Source: Ambulatory Visit | Attending: Neurosurgery | Admitting: Neurosurgery

## 2018-04-07 ENCOUNTER — Ambulatory Visit (HOSPITAL_COMMUNITY): Payer: 59

## 2018-04-07 DIAGNOSIS — M5412 Radiculopathy, cervical region: Secondary | ICD-10-CM

## 2018-04-07 DIAGNOSIS — M50221 Other cervical disc displacement at C4-C5 level: Secondary | ICD-10-CM | POA: Diagnosis not present

## 2018-04-13 DIAGNOSIS — M542 Cervicalgia: Secondary | ICD-10-CM | POA: Diagnosis not present

## 2018-04-14 ENCOUNTER — Other Ambulatory Visit: Payer: Self-pay | Admitting: Internal Medicine

## 2018-04-14 MED FILL — METFORMIN HCL ER 500 MG TAB: 500 | 90 days supply | Qty: 90 | Fill #0

## 2018-04-14 MED FILL — ASPIRIN LOW DOSE 81 MG CHEW: 81 | 90 days supply | Qty: 90 | Fill #1

## 2018-04-21 DIAGNOSIS — M542 Cervicalgia: Secondary | ICD-10-CM | POA: Diagnosis not present

## 2018-06-15 MED FILL — ROSUVASTATIN CALCIUM 20 MG: 20 | 30 days supply | Qty: 30 | Fill #0

## 2018-06-15 MED FILL — METOPROLOL TARTRATE 25 MG T: 25 | 90 days supply | Qty: 180 | Fill #1

## 2018-07-24 MED FILL — ROSUVASTATIN CALCIUM 20 MG: 20 | 30 days supply | Qty: 30 | Fill #1

## 2018-07-24 MED FILL — METFORMIN HCL ER 500 MG TAB: 500 | 90 days supply | Qty: 90 | Fill #0

## 2018-07-24 MED FILL — ASPIRIN LOW DOSE 81 MG CHEW: 81 | 90 days supply | Qty: 90 | Fill #2

## 2018-07-24 MED FILL — PANTOPRAZOLE SOD DR 40 MG T: 40 | 30 days supply | Qty: 30 | Fill #1

## 2018-08-10 DIAGNOSIS — R7303 Prediabetes: Secondary | ICD-10-CM | POA: Diagnosis not present

## 2018-08-10 DIAGNOSIS — E7841 Elevated Lipoprotein(a): Secondary | ICD-10-CM | POA: Diagnosis not present

## 2018-08-10 DIAGNOSIS — E559 Vitamin D deficiency, unspecified: Secondary | ICD-10-CM | POA: Diagnosis not present

## 2018-08-10 DIAGNOSIS — I25118 Atherosclerotic heart disease of native coronary artery with other forms of angina pectoris: Secondary | ICD-10-CM | POA: Diagnosis not present

## 2018-08-10 DIAGNOSIS — E7800 Pure hypercholesterolemia, unspecified: Secondary | ICD-10-CM | POA: Diagnosis not present

## 2018-08-19 DIAGNOSIS — I25118 Atherosclerotic heart disease of native coronary artery with other forms of angina pectoris: Secondary | ICD-10-CM | POA: Diagnosis not present

## 2018-08-19 DIAGNOSIS — E7800 Pure hypercholesterolemia, unspecified: Secondary | ICD-10-CM | POA: Diagnosis not present

## 2018-08-19 DIAGNOSIS — I209 Angina pectoris, unspecified: Secondary | ICD-10-CM | POA: Diagnosis not present

## 2018-08-19 DIAGNOSIS — R7303 Prediabetes: Secondary | ICD-10-CM | POA: Diagnosis not present

## 2018-08-20 MED FILL — ROSUVASTATIN CALCIUM 20 MG: 20 | 90 days supply | Qty: 90 | Fill #0

## 2018-08-20 MED FILL — PANTOPRAZOLE SOD DR 40 MG T: 40 | 90 days supply | Qty: 90 | Fill #0

## 2018-09-08 MED FILL — METOPROLOL TARTRATE 25 MG T: 25 | 90 days supply | Qty: 180 | Fill #0

## 2018-10-30 MED FILL — metFORMIN HCL ER 500 MG TB2: 500 | 90 days supply | Qty: 90 | Fill #1

## 2018-10-30 MED FILL — ASPIRIN LOW DOSE 81 MG CHEW: 81 | 90 days supply | Qty: 90 | Fill #3

## 2018-11-03 DIAGNOSIS — H9313 Tinnitus, bilateral: Secondary | ICD-10-CM | POA: Diagnosis not present

## 2018-11-03 DIAGNOSIS — H903 Sensorineural hearing loss, bilateral: Secondary | ICD-10-CM | POA: Diagnosis not present

## 2018-11-03 MED FILL — FLUTICASONE PROP 50 MCG SPR: 50 | 30 days supply | Qty: 16 | Fill #0

## 2018-11-30 MED FILL — ROSUVASTATIN CALCIUM 20 MG: 20 | 90 days supply | Qty: 90 | Fill #1

## 2018-12-09 MED FILL — MELOXICAM 15 MG TABLET: 15 | 30 days supply | Qty: 30 | Fill #0

## 2018-12-09 MED FILL — METOPROLOL TARTRATE 25 MG T: 25 | 90 days supply | Qty: 180 | Fill #1

## 2019-01-12 ENCOUNTER — Other Ambulatory Visit: Payer: Self-pay | Admitting: Cardiology

## 2019-01-12 ENCOUNTER — Other Ambulatory Visit: Payer: Self-pay

## 2019-01-12 ENCOUNTER — Other Ambulatory Visit: Payer: Self-pay | Admitting: Internal Medicine

## 2019-01-12 MED ORDER — ASPIRIN 81 MG PO CHEW: CHEWABLE_TABLET | ORAL | 1 refills | Status: DC

## 2019-01-12 MED ORDER — NITROGLYCERIN 0.4 MG SL SUBL: SUBLINGUAL_TABLET | SUBLINGUAL | 1 refills | Status: DC

## 2019-01-12 MED FILL — ASPIRIN LOW DOSE 81 MG CHEW: 81 | 90 days supply | Qty: 180 | Fill #0

## 2019-01-12 MED FILL — NITROGLYCERIN 0.4 MG TAB SL: 0.4 | 8 days supply | Qty: 25 | Fill #0

## 2019-01-12 MED FILL — metFORMIN HCL ER 500 MG TB2: 500 | 90 days supply | Qty: 90 | Fill #0

## 2019-01-19 DIAGNOSIS — R3915 Urgency of urination: Secondary | ICD-10-CM | POA: Diagnosis not present

## 2019-01-19 MED FILL — TAMSULOSIN HCL 0.4 MG CAP: 0.4 | 90 days supply | Qty: 90 | Fill #0

## 2019-02-08 ENCOUNTER — Other Ambulatory Visit: Payer: Self-pay

## 2019-02-08 MED ORDER — GLUCOSE BLOOD VI STRP: ORAL_STRIP | 12 refills | Status: DC

## 2019-02-08 MED FILL — ACCU-CHEK GUIDE TEST STRIP: 50 days supply | Qty: 50 | Fill #0

## 2019-02-08 NOTE — Telephone Encounter (Signed)
Received fax from [facility] asking to change strips to Accu-chek Guide, testing once a day, requesting 50.

## 2019-02-24 MED FILL — ROSUVASTATIN CALCIUM 20 MG: 20 | 90 days supply | Qty: 90 | Fill #2

## 2019-02-24 MED FILL — CYCLOBENZAPRINE HCL 10 MG T: 10 | 7 days supply | Qty: 20 | Fill #1

## 2019-02-24 MED FILL — METOPROLOL TARTRATE 25 MG T: 25 | 90 days supply | Qty: 180 | Fill #2

## 2019-05-18 ENCOUNTER — Other Ambulatory Visit: Payer: Self-pay | Admitting: Cardiology

## 2019-05-18 DIAGNOSIS — R739 Hyperglycemia, unspecified: Secondary | ICD-10-CM

## 2019-05-18 DIAGNOSIS — I25118 Atherosclerotic heart disease of native coronary artery with other forms of angina pectoris: Secondary | ICD-10-CM

## 2019-05-18 DIAGNOSIS — E559 Vitamin D deficiency, unspecified: Secondary | ICD-10-CM

## 2019-05-18 DIAGNOSIS — K219 Gastro-esophageal reflux disease without esophagitis: Secondary | ICD-10-CM

## 2019-05-18 DIAGNOSIS — E7800 Pure hypercholesterolemia, unspecified: Secondary | ICD-10-CM

## 2019-05-18 NOTE — Progress Notes (Signed)
Primary Physician/Referring:  Merrilee Seashore, MD  Patient ID: Dustin [name], male    DOB: 1965/12/18, 53 y.o.   MRN: [id number]  No chief complaint on file.  HPI:    HPI: Dustin [name]  is a 53 y.o. 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 12/11/2017 and was able to exercise for 10 minutes and achieved 11.78 Mets, but had ischemic EKG along with chest pain for which he underwent coronary CTA on 12/15/2017, revealing calcium score of 25, high-grade stenosis in the mid LAD and diffuse disease involving all 3 major vessels. He has recommended aggressive medical therapy under started on statin along with aspirin and beta blocker.  He is presently doing well, has noticed marked improvement in exercise capacity and also has had very minimal occasional chest discomfort at the onset of starting exercise but able to perform all his duties and also exercise without any limitations. He has not used any sublingual nitroglycerin. Tolerating all his medications well.   Past Medical History:  Diagnosis Date  . Dysesthesia of multiple sites 01/16/2016   Fluctuating Hot & cold sensation primarily legs, sometimes back or saddle area since 11/16  . Impaired glucose tolerance   . 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 Vision abnormalities   . Vitamin deficiency    Past Surgical History:  Procedure Laterality Date  . TONSILLECTOMY     Social History   Socioeconomic History  . Marital status: Married    Spouse name: Not on file  . Number of children: Not on file  . Years of education: Not on file  . Highest education level: Not on file  Occupational History  . Not on file  Social Needs  . Financial resource strain: Not on file  . Food insecurity    Worry: Not on file    Inability: Not on file  .  Transportation needs    Medical: Not on file    Non-medical: Not on file  Tobacco Use  . Smoking status: Never Smoker  . Smokeless tobacco: Never Used  Substance and Sexual Activity  . Alcohol use: Yes    Alcohol/week: 0.0 standard drinks    Comment: occasionally, 1-2 scotch 3-4 times monthly  . Drug use: No  . Sexual activity: Not on file  Lifestyle  . Physical activity    Days per week: Not on file    Minutes per session: Not on file  . Stress: Not on file  Relationships  . Social Herbalist on phone: Not on file    Gets together: Not on file    Attends religious service: Not on file    Active member of club or organization: Not on file    Attends meetings of clubs or organizations: Not on file    Relationship status: Not on file  . Intimate partner violence    Fear of current or ex partner: Not on file    Emotionally abused: Not on file    Physically abused: Not on file    Forced sexual activity: Not on file  Other Topics Concern  . Not on file  Social History Narrative   Lives with wife and 2 children in a 2 story home.     Works as a Engineer, drilling for [employer].   ROS  Review of Systems  Constitution:  Negative for chills, decreased appetite, malaise/fatigue and weight gain.  HENT: Positive for tinnitus.   Cardiovascular: Positive for chest pain (rare and mild). Negative for dyspnea on exertion, leg swelling and syncope.  Endocrine: Negative for cold intolerance.  Hematologic/Lymphatic: Does not bruise/bleed easily.  Musculoskeletal: Positive for neck pain. Negative for joint swelling.  Gastrointestinal: Negative for abdominal pain, anorexia, change in bowel habit, hematochezia and melena.  Neurological: Negative for headaches and light-headedness.  Psychiatric/Behavioral: Negative for depression and substance abuse.  All other systems reviewed and are negative.  Objective  There were no vitals taken for this visit. There is no height or weight on file to  calculate BMI.   Physical Exam  Constitutional: He appears well-developed and well-nourished. No distress.  HENT:  Head: Atraumatic.  Eyes: Conjunctivae are normal.  Neck: Neck supple. No JVD present. No thyromegaly present.  Cardiovascular: Normal rate, regular rhythm, normal heart sounds and intact distal pulses. Exam reveals no gallop.  No murmur heard. Pulmonary/Chest: Effort normal and breath sounds normal.  Abdominal: Soft. Bowel sounds are normal.  Musculoskeletal: Normal range of motion.  Neurological: He is alert.  Skin: Skin is warm and dry.  Psychiatric: He has a normal mood and affect.   Radiology: No results found.  Laboratory examination:   08/11/2018: Hemoglobin A1c 5.7%. Vitamin D 49.3. Lipoprotein a 137.7. Cholesterol 111, triglycerides 108, HDL 50, LDL 39. Glucose 119, creatinine 1.09, EGFR 79/91, potassium 4.7, CMP otherwise normal.  02/11/2018: Creatinine 1.15, EGFR 74/85, potassium 4.8, AST 41, ALT 47, CMP otherwise normal. Cholesterol 128, triglycerides 108, HDL 51, LDL 55.  12/29/2017: Lipoprotein a 126.  CMP Latest Ref Rng & Units 12/29/2015  Glucose 70 - 99 mg/dL 98  BUN 6 - 23 mg/dL 20  Creatinine 0.40 - 1.50 mg/dL 1.10  Sodium 135 - 145 mEq/L 140  Potassium 3.5 - 5.1 mEq/L 4.1  Chloride 96 - 112 mEq/L 105  CO2 19 - 32 mEq/L 30  Calcium 8.4 - 10.5 mg/dL 9.4  Total Protein 6.0 - 8.3 g/dL 7.1  Total Bilirubin 0.2 - 1.2 mg/dL 0.5  Alkaline Phos 39 - 117 U/L 64  AST 0 - 37 U/L 21  ALT 0 - 53 U/L 44   CBC Latest Ref Rng & Units 01/15/2016 12/29/2015  WBC 3.4 - 10.8 x10E3/uL 6.6 5.9  Hemoglobin 12.6 - 17.7 g/dL 17.1 17.5(H)  Hematocrit 37.5 - 51.0 % 50.6 53.1(H)  Platelets 150 - 379 x10E3/uL 273 230.0   Lipid Panel     Component Value Date/Time   CHOL 185 05/30/2016 0845   TRIG 151.0 (H) 05/30/2016 0845   HDL 43.00 05/30/2016 0845   CHOLHDL 4 05/30/2016 0845   VLDL 30.2 05/30/2016 0845   LDLCALC 111 (H) 05/30/2016 0845   HEMOGLOBIN A1C Lab  Results  Component Value Date   HGBA1C 6.2 12/09/2017   TSH No results for input(s): TSH in the last 8760 hours. Medications   Current Outpatient Medications  Medication Instructions  . aspirin (ASPIRIN LOW DOSE) 81 MG chewable tablet CHEW AND SWALLOW 1 TABLET BY MOUTH DAILY  . cholecalciferol (VITAMIN D) 5,000 Units, Oral, Every other day  . cyanocobalamin 5,000 mcg, Oral, Every other day  . glucose blood (ACCU-CHEK GUIDE) test strip Use as instructed to check blood sugar once a day  . metFORMIN (GLUCOPHAGE-XR) 500 MG 24 hr tablet TAKE 1 TABLET BY MOUTH ONCE DAILY AT DINNER  . MICROLET LANCETS MISC Use 1x a day  . nitroGLYCERIN (NITROSTAT) 0.4 MG SL tablet  DISSOLVE 1 TABLET BY MOUTH EVERY 5 MINUTES AS NEEDED FOR CHEST PAIN  . NONFORMULARY OR COMPOUNDED ITEM Mix Amitriptyline 2%, Lidocaine 2%, and Ketamine 1% - apply on feet 2x a day - dispense 100 mL    Cardiac Studies:   Sleep Study  07/10/2016:  Mild obstructive sleep apnea occurred during this study (AHI = 8.7/h). - No significant central sleep apnea occurred during this study (CAI = 0.0/h). - Moderate oxygen desaturation was noted during this study (Min O2 = 86%). - Patient snored 2.0% during the sleep  Treadmill stress test  12/11/2017: Indication: Chest pain. The patient exercised on Bruce protocol for 10:00 min. Patient achieved 11.78 METS and reached HR 194 bpm, which is 113% of maximum age-predicted HR. Stress test terminated due to fatigue. Chest Pain: non-limiting. BP Response to Exercise: Normal resting BP- appropriate response. Arrhythmias: none. HR Response to Exercise: Appropriate. ST Changes: With peak exercise there was down-sloping ST depression of 2 mm in the inferior leads and 2 mm upsloping ST depression in lateral leads which persisted for >2 minutes into recovery. This was positive for ischemia and associated with chest tightness which improved in rest. Overall Impression: Positive stress test typical of  ischemia. Consider furtner cardiac work-up.  Coronary CTA 12/15/2017: Coronary calcium score 25. Right dominant. Long Mid LAD 70% stenosis. Distal RCA 25-50% diffuse disease, circumflex made 25% stenosis.FFR significant. LAD: Proximal CT FFR: 0.89, mid: 0.83, distal 0.76. hemodynamically significant stenosis in the mid LAD.  Echocardiogram 12/23/2017: Left ventricle cavity is normal in size. Normal global wall motion. Normal diastolic filling pattern. Calculated EF 67%. No significant valvular abnormality. Inadequate tricuspid regurgitation jet to estimate pulmonary artery pressure. Estimated RA pressure 8 mmHg.  Assessment     ICD-10-CM   1. Coronary artery disease of native artery of native heart with stable angina pectoris (HCC)  I25.118 CBC    TSH    TSH    CBC  2. Hypercholesteremia  E78.00 Apo A1 + B + Ratio    LDL cholesterol, direct    Lipid Panel With LDL/HDL Ratio    Lipoprotein A (LPA)    Lipoprotein A (LPA)    Lipid Panel With LDL/HDL Ratio    LDL cholesterol, direct    Apo A1 + B + Ratio  3. Hyperglycemia  R73.9 Hgb A1c w/o eAG    Hgb A1c w/o eAG  4. Hypovitaminosis D  E55.9 Vitamin D 1,25 dihydroxy    Vitamin D 1,25 dihydroxy  5. Gastroesophageal reflux disease without esophagitis  K21.9     EKG 08/16/2018: Normal sinus rhythm at rate of 72 bpm, normal axis. No evidence of ischemia, normal EKG. Low-voltage complexes. No significant change from 02/17/2018.  Recommendations:   This was an orders only encounter, he is presently doing well and except for very minimal chest discomfort with extremes of exertion, he has been exercising on a regular basis and sometimes walking on the treadmill for 5 miles without significant discomfort.  Labs have been ordered, I will see him back in the office in 1 to 2 months once COVID-19 issues are resolved.  He is aware he can contact me at any point.  Adrian Prows, MD, [facility] 05/18/2019, 5:25 PM [facility] [place] Pager:  [phone] Office: [phone] If no answer Cell [phone]

## 2019-05-21 MED FILL — METFORMIN HCL ER 500 MG TB2: 500 | 30 days supply | Qty: 30 | Fill #1

## 2019-05-23 MED FILL — ROSUVASTATIN CALCIUM 20 MG: 20 | 90 days supply | Qty: 90 | Fill #3

## 2019-05-25 MED FILL — METOPROLOL TARTRATE 25 MG T: 25 | 90 days supply | Qty: 180 | Fill #3

## 2019-05-28 MED FILL — tiZANidine HCL 2 MG TABS: 2 | 60 days supply | Qty: 60 | Fill #0

## 2019-05-28 MED FILL — predniSONE 10 MG TABS: 10 | 12 days supply | Qty: 30 | Fill #0

## 2019-06-07 ENCOUNTER — Other Ambulatory Visit: Payer: Self-pay | Admitting: Cardiology

## 2019-06-07 DIAGNOSIS — E7800 Pure hypercholesterolemia, unspecified: Secondary | ICD-10-CM | POA: Diagnosis not present

## 2019-06-07 DIAGNOSIS — I25118 Atherosclerotic heart disease of native coronary artery with other forms of angina pectoris: Secondary | ICD-10-CM | POA: Diagnosis not present

## 2019-06-07 DIAGNOSIS — E559 Vitamin D deficiency, unspecified: Secondary | ICD-10-CM | POA: Diagnosis not present

## 2019-06-07 DIAGNOSIS — R739 Hyperglycemia, unspecified: Secondary | ICD-10-CM | POA: Diagnosis not present

## 2019-06-09 LAB — CBC WITH DIFFERENTIAL/PLATELET
Basophils Absolute: 0 10*3/uL (ref 0.0–0.2)
Basos: 1 %
EOS (ABSOLUTE): 0.1 10*3/uL (ref 0.0–0.4)
Eos: 2 %
Hematocrit: 50.5 % (ref 37.5–51.0)
Hemoglobin: 16 g/dL (ref 13.0–17.7)
Immature Grans (Abs): 0 10*3/uL (ref 0.0–0.1)
Immature Granulocytes: 0 %
Lymphocytes Absolute: 2.1 10*3/uL (ref 0.7–3.1)
Lymphs: 30 %
MCH: 29.9 pg (ref 26.6–33.0)
MCHC: 31.7 g/dL (ref 31.5–35.7)
MCV: 94 fL (ref 79–97)
Monocytes Absolute: 0.5 10*3/uL (ref 0.1–0.9)
Monocytes: 7 %
Neutrophils Absolute: 4.2 10*3/uL (ref 1.4–7.0)
Neutrophils: 60 %
Platelets: 220 10*3/uL (ref 150–450)
RBC: 5.35 x10E6/uL (ref 4.14–5.80)
RDW: 12 % (ref 11.6–15.4)
WBC: 7 10*3/uL (ref 3.4–10.8)

## 2019-06-09 LAB — SPECIMEN STATUS REPORT

## 2019-06-11 LAB — VITAMIN D 1,25 DIHYDROXY
Vitamin D 1, 25 (OH)2 Total: 53 pg/mL
Vitamin D2 1, 25 (OH)2: <10 pg/mL
Vitamin D3 1, 25 (OH)2: 53 pg/mL

## 2019-06-11 LAB — APO A1 + B + RATIO
Apolipo. B/A-1 Ratio: 0.6 ratio (ref 0.0–0.7)
Apolipoprotein A-1: 146 mg/dL (ref 101–178)
Apolipoprotein B: 83 mg/dL (ref <90)

## 2019-06-11 LAB — LIPID PANEL WITH LDL/HDL RATIO
Cholesterol, Total: 145 mg/dL (ref 100–199)
HDL: 47 mg/dL (ref >39)
LDL Calculated: 74 mg/dL (ref 0–99)
LDl/HDL Ratio: 1.6 ratio (ref 0.0–3.6)
Triglycerides: 118 mg/dL (ref 0–149)
VLDL Cholesterol Cal: 24 mg/dL (ref 5–40)

## 2019-06-11 LAB — TSH: TSH: 4.15 u[IU]/mL (ref 0.450–4.500)

## 2019-06-11 LAB — LDL CHOLESTEROL, DIRECT: LDL Direct: 79 mg/dL (ref 0–99)

## 2019-06-11 LAB — HGB A1C W/O EAG: Hgb A1c MFr Bld: 5.9 % — ABNORMAL HIGH (ref 4.8–5.6)

## 2019-06-11 LAB — LIPOPROTEIN A (LPA): Lipoprotein (a): 106.1 nmol/L — ABNORMAL HIGH (ref <75.0)

## 2019-06-14 ENCOUNTER — Other Ambulatory Visit: Payer: Self-pay | Admitting: Cardiology

## 2019-06-14 DIAGNOSIS — I25118 Atherosclerotic heart disease of native coronary artery with other forms of angina pectoris: Secondary | ICD-10-CM

## 2019-06-14 DIAGNOSIS — R739 Hyperglycemia, unspecified: Secondary | ICD-10-CM

## 2019-06-15 DIAGNOSIS — I25118 Atherosclerotic heart disease of native coronary artery with other forms of angina pectoris: Secondary | ICD-10-CM | POA: Diagnosis not present

## 2019-06-15 DIAGNOSIS — R739 Hyperglycemia, unspecified: Secondary | ICD-10-CM | POA: Diagnosis not present

## 2019-06-16 LAB — CMP14+EGFR
ALT: 24 IU/L (ref 0–44)
AST: 20 IU/L (ref 0–40)
Albumin/Globulin Ratio: 1.9 (ref 1.2–2.2)
Albumin: 4.7 g/dL (ref 3.8–4.9)
Alkaline Phosphatase: 64 IU/L (ref 39–117)
BUN/Creatinine Ratio: 13 (ref 9–20)
BUN: 13 mg/dL (ref 6–24)
Bilirubin Total: 0.4 mg/dL (ref 0.0–1.2)
CO2: 23 mmol/L (ref 20–29)
Calcium: 9.8 mg/dL (ref 8.7–10.2)
Chloride: 100 mmol/L (ref 96–106)
Creatinine, Ser: 1.01 mg/dL (ref 0.76–1.27)
GFR calc Af Amer: 99 mL/min/{1.73_m2} (ref >59)
GFR calc non Af Amer: 86 mL/min/{1.73_m2} (ref >59)
Globulin, Total: 2.5 g/dL (ref 1.5–4.5)
Glucose: 85 mg/dL (ref 65–99)
Potassium: 4.1 mmol/L (ref 3.5–5.2)
Sodium: 142 mmol/L (ref 134–144)
Total Protein: 7.2 g/dL (ref 6.0–8.5)

## 2019-06-17 DIAGNOSIS — M6281 Muscle weakness (generalized): Secondary | ICD-10-CM | POA: Diagnosis not present

## 2019-06-17 DIAGNOSIS — M79602 Pain in left arm: Secondary | ICD-10-CM | POA: Diagnosis not present

## 2019-06-17 DIAGNOSIS — M542 Cervicalgia: Secondary | ICD-10-CM | POA: Diagnosis not present

## 2019-06-21 DIAGNOSIS — M6281 Muscle weakness (generalized): Secondary | ICD-10-CM | POA: Diagnosis not present

## 2019-06-21 DIAGNOSIS — M542 Cervicalgia: Secondary | ICD-10-CM | POA: Diagnosis not present

## 2019-06-21 DIAGNOSIS — M79602 Pain in left arm: Secondary | ICD-10-CM | POA: Diagnosis not present

## 2019-06-25 MED FILL — metFORMIN HCL ER 500 MG TB2: 500 | 30 days supply | Qty: 30 | Fill #2

## 2019-06-29 DIAGNOSIS — M79602 Pain in left arm: Secondary | ICD-10-CM | POA: Diagnosis not present

## 2019-06-29 DIAGNOSIS — M542 Cervicalgia: Secondary | ICD-10-CM | POA: Diagnosis not present

## 2019-06-29 DIAGNOSIS — M6281 Muscle weakness (generalized): Secondary | ICD-10-CM | POA: Diagnosis not present

## 2019-07-02 DIAGNOSIS — M542 Cervicalgia: Secondary | ICD-10-CM | POA: Diagnosis not present

## 2019-07-02 DIAGNOSIS — M6281 Muscle weakness (generalized): Secondary | ICD-10-CM | POA: Diagnosis not present

## 2019-07-02 DIAGNOSIS — M79602 Pain in left arm: Secondary | ICD-10-CM | POA: Diagnosis not present

## 2019-07-26 ENCOUNTER — Other Ambulatory Visit (HOSPITAL_COMMUNITY)
Admission: RE | Admit: 2019-07-26 | Discharge: 2019-07-26 | Disposition: A | Discharge status: Home / Self Care | Payer: 59 | Source: Other Acute Inpatient Hospital | Attending: Occupational Medicine | Admitting: Occupational Medicine

## 2019-07-26 DIAGNOSIS — Z20828 Contact with and (suspected) exposure to other viral communicable diseases: Secondary | ICD-10-CM | POA: Diagnosis not present

## 2019-07-27 LAB — SARS CORONAVIRUS 2 (TAT 6-24 HRS): SARS Coronavirus 2: NEGATIVE

## 2019-07-28 MED FILL — METFORMIN HCL ER 500 MG TB2: 500 | 90 days supply | Qty: 90 | Fill #3

## 2019-08-14 MED FILL — FLUARIX QUADRIVALENT 0.5 ML: 0.5 | 1 days supply | Qty: 1 | Fill #0

## 2019-08-24 MED FILL — ASPIRIN LOW DOSE 81 MG CHEW: 81 | 90 days supply | Qty: 90 | Fill #0

## 2019-08-26 ENCOUNTER — Other Ambulatory Visit: Payer: Self-pay | Admitting: Cardiology

## 2019-08-26 DIAGNOSIS — I10 Essential (primary) hypertension: Secondary | ICD-10-CM

## 2019-08-26 DIAGNOSIS — I2510 Atherosclerotic heart disease of native coronary artery without angina pectoris: Secondary | ICD-10-CM

## 2019-08-26 MED ORDER — METOPROLOL SUCCINATE ER 50 MG PO TB24: 50.0000 mg | ORAL_TABLET | Freq: Every day | ORAL | 3 refills | Status: DC

## 2019-08-26 MED FILL — METOPROLOL SUCCINATE ER 50: 50 | 90 days supply | Qty: 90 | Fill #0

## 2019-09-08 DIAGNOSIS — H524 Presbyopia: Secondary | ICD-10-CM | POA: Diagnosis not present

## 2019-09-08 DIAGNOSIS — H5203 Hypermetropia, bilateral: Secondary | ICD-10-CM | POA: Diagnosis not present

## 2019-09-08 DIAGNOSIS — H52203 Unspecified astigmatism, bilateral: Secondary | ICD-10-CM | POA: Diagnosis not present

## 2019-10-07 ENCOUNTER — Other Ambulatory Visit: Payer: Self-pay | Admitting: Cardiology

## 2019-10-07 MED FILL — PANTOPRAZOLE SOD DR 40 MG T: 40 | 30 days supply | Qty: 30 | Fill #0

## 2019-11-03 MED FILL — metFORMIN HCL ER 500 MG TB2: 500 | 90 days supply | Qty: 90 | Fill #4

## 2019-11-12 MED FILL — SUCRALFATE 1 GM TABLET: 1 | 30 days supply | Qty: 120 | Fill #0

## 2019-11-12 MED FILL — PANTOPRAZOLE SOD DR 40 MG T: 40 | 90 days supply | Qty: 180 | Fill #0

## 2019-11-22 ENCOUNTER — Other Ambulatory Visit (HOSPITAL_COMMUNITY): Payer: Self-pay | Admitting: Nephrology

## 2019-11-22 DIAGNOSIS — R1013 Epigastric pain: Secondary | ICD-10-CM

## 2019-11-23 ENCOUNTER — Other Ambulatory Visit: Payer: Self-pay

## 2019-11-23 ENCOUNTER — Ambulatory Visit (HOSPITAL_COMMUNITY)
Admission: RE | Admit: 2019-11-23 | Discharge: 2019-11-23 | Disposition: A | Discharge status: Home / Self Care | Payer: 59 | Source: Ambulatory Visit | Attending: Nephrology | Admitting: Nephrology

## 2019-11-23 DIAGNOSIS — R1013 Epigastric pain: Secondary | ICD-10-CM | POA: Insufficient documentation

## 2019-11-23 DIAGNOSIS — K824 Cholesterolosis of gallbladder: Secondary | ICD-10-CM | POA: Diagnosis not present

## 2019-12-01 DIAGNOSIS — Z1159 Encounter for screening for other viral diseases: Secondary | ICD-10-CM | POA: Diagnosis not present

## 2019-12-01 DIAGNOSIS — R1013 Epigastric pain: Secondary | ICD-10-CM | POA: Diagnosis not present

## 2019-12-03 DIAGNOSIS — R1013 Epigastric pain: Secondary | ICD-10-CM | POA: Diagnosis not present

## 2019-12-03 DIAGNOSIS — K2930 Chronic superficial gastritis without bleeding: Secondary | ICD-10-CM | POA: Diagnosis not present

## 2019-12-21 MED FILL — ASPIRIN LOW DOSE 81 MG CHEW: 81 | 90 days supply | Qty: 90 | Fill #1

## 2019-12-21 MED FILL — METOPROLOL SUCCINATE ER 50: 50 | 90 days supply | Qty: 90 | Fill #1

## 2020-01-14 ENCOUNTER — Encounter: Payer: Self-pay | Admitting: Cardiology

## 2020-01-14 ENCOUNTER — Ambulatory Visit: Payer: 59 | Admitting: Cardiology

## 2020-01-14 ENCOUNTER — Other Ambulatory Visit: Payer: Self-pay

## 2020-01-14 VITALS — BP 124/84 | HR 109 | Temp 97.3°F | Resp 16 | Ht 72.0 in | Wt 205.1 lb

## 2020-01-14 DIAGNOSIS — R739 Hyperglycemia, unspecified: Secondary | ICD-10-CM

## 2020-01-14 DIAGNOSIS — Z8249 Family history of ischemic heart disease and other diseases of the circulatory system: Secondary | ICD-10-CM | POA: Diagnosis not present

## 2020-01-14 DIAGNOSIS — I2510 Atherosclerotic heart disease of native coronary artery without angina pectoris: Secondary | ICD-10-CM | POA: Diagnosis not present

## 2020-01-14 DIAGNOSIS — I10 Essential (primary) hypertension: Secondary | ICD-10-CM

## 2020-01-14 DIAGNOSIS — I208 Other forms of angina pectoris: Secondary | ICD-10-CM | POA: Diagnosis not present

## 2020-01-14 DIAGNOSIS — I25118 Atherosclerotic heart disease of native coronary artery with other forms of angina pectoris: Secondary | ICD-10-CM

## 2020-01-14 DIAGNOSIS — E7800 Pure hypercholesterolemia, unspecified: Secondary | ICD-10-CM | POA: Diagnosis not present

## 2020-01-14 DIAGNOSIS — E7841 Elevated Lipoprotein(a): Secondary | ICD-10-CM | POA: Diagnosis not present

## 2020-01-14 MED ORDER — METOPROLOL TARTRATE 25 MG PO TABS: 25.0000 mg | ORAL_TABLET | Freq: Two times a day (BID) | ORAL | 3 refills | Status: DC

## 2020-01-14 MED FILL — METOPROLOL TARTRATE 25 MG T: 25 | 90 days supply | Qty: 180 | Fill #0

## 2020-01-14 NOTE — Progress Notes (Signed)
Primary Physician/Referring:  Merrilee Seashore, MD  Patient ID: Dustin [name], male    DOB: [birth year], 54 y.o.   MRN: [id number]  Chief Complaint  Patient presents with  . Coronary Artery Disease  . Hyperlipidemia  . Hypertension  . 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 Asian [place] Male with family history of coronary artery disease in his father at age 52Y,  h/o prediabetes, nonspecific peripheral neuropathy, mild hyperlipidemia sometime in Jan 2019 started noticing tightness in the middle of the chest with exertional activity. 12/11/2017 and was able to exercise for 10 minutes and achieved 11.78 Mets, but had ischemic EKG along with chest pain for which he underwent coronary CTA on 12/15/2017, revealing calcium score of 25, high-grade stenosis in the mid LAD and diffuse disease involving all 3 major vessels. He has recommended aggressive medical therapy under started on statin along with aspirin and beta blocker.   He developed mild abdominal discomfort ongoing for the past few months, on the curbside I did advise him to wean himself off of the beta-blocker and to watch the blood pressure.  He now presents for his annual visit.  He is fairly active, but still continues to have chest discomfort both indicative of exertional angina and also musculoskeletal etiology.  Also since discontinuing beta-blocker therapy, over the past 10 days since, has noticed decreased exercise tolerance and just generally not feeling well.  Past Medical History:  Diagnosis Date  . Dysesthesia of multiple sites 01/16/2016   Fluctuating Hot & cold sensation primarily legs, sometimes back or saddle area since 11/16  . Impaired glucose tolerance   . 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 Vision abnormalities   . Vitamin deficiency    Past Surgical History:  Procedure Laterality Date  . TONSILLECTOMY     Family History  Problem Relation Age of Onset  . Other Mother        other   . Diabetes type II Father   . Coronary artery disease Father   . Healthy Brother   . Healthy Son   . Healthy Daughter   . Diabetes Other        Paternal aunt  . CAD Other        Paternal uncle    Social History   Tobacco Use  . Smoking status: Never Smoker  . Smokeless tobacco: Never Used  Substance Use Topics  . Alcohol use: Yes    Alcohol/week: 0.0 standard drinks    Comment: occasionally, 1-2 scotch 3-4 times monthly   ROS  Review of Systems  Cardiovascular: Positive for chest pain. Negative for dyspnea on exertion and leg swelling.  Gastrointestinal: Positive for abdominal pain. Negative for melena.   Objective  Blood pressure 124/84, pulse (!) 109, temperature (!) 97.3 F (36.3 C), temperature source Temporal, resp. rate 16, height 6' (1.829 m), weight 205 lb 1.6 oz (93 kg), SpO2 98 %.  Vitals with BMI 01/14/2020 01/14/2020 12/15/2017  Height - 6\' 0"  -  Weight - 205 lbs 2 oz -  BMI - [id number] -  Systolic [id number]  Diastolic 84 79 76  Pulse 0000000 106 72     Physical Exam  Cardiovascular: Normal rate, regular rhythm, normal heart sounds and intact distal pulses. Exam reveals no gallop.  No murmur heard. No leg edema, no JVD.  Pulmonary/Chest: Effort normal and breath sounds normal.  Abdominal: Soft. Bowel sounds are normal.  Laboratory examination:   Recent Labs    06/15/19 1636  NA 142  K 4.1  CL 100  CO2 23  GLUCOSE 85  BUN 13  CREATININE 1.01  CALCIUM 9.8  GFRNONAA 86  GFRAA 99   CrCl cannot be calculated (Patient's most recent lab result is older than the maximum 21 days allowed.).  CMP Latest Ref Rng & Units 06/15/2019 12/29/2015  Glucose 65 - 99 mg/dL 85 98  BUN 6 - 24 mg/dL 13 20  Creatinine 0.76 - 1.27 mg/dL 1.01 1.10  Sodium 134 - 144 mmol/L 142 140  Potassium 3.5 - 5.2 mmol/L 4.1 4.1  Chloride 96 - 106 mmol/L 100 105  CO2 20 - 29 mmol/L 23 30  Calcium 8.7 - 10.2 mg/dL 9.8 9.4  Total Protein 6.0 - 8.5 g/dL 7.2 7.1  Total Bilirubin 0.0 - 1.2  mg/dL 0.4 0.5  Alkaline Phos 39 - 117 IU/L 64 64  AST 0 - 40 IU/L 20 21  ALT 0 - 44 IU/L 24 44   CBC Latest Ref Rng & Units 06/07/2019 01/15/2016 12/29/2015  WBC 3.4 - 10.8 x10E3/uL 7.0 6.6 5.9  Hemoglobin 13.0 - 17.7 g/dL 16.0 17.1 17.5(H)  Hematocrit 37.5 - 51.0 % 50.5 50.6 53.1(H)  Platelets 150 - 450 x10E3/uL 220 273 230.0   Lipid Panel     Component Value Date/Time   CHOL 145 06/07/2019 0918   TRIG 118 06/07/2019 0918   HDL 47 06/07/2019 0918   CHOLHDL 4 05/30/2016 0845   VLDL 30.2 05/30/2016 0845   LDLCALC 74 06/07/2019 0918   LDLDIRECT 79 06/07/2019 0918        Lipoprotein a        126.                                   12/29/2017  HEMOGLOBIN A1C Lab Results  Component Value Date   HGBA1C 5.9 (H) 06/07/2019   TSH Recent Labs    06/07/19 0918  TSH 4.150   External labs:   Labs 12/12/2017: HB 16.8/HCT 49.3, platelets 206, indicis normal.  Serum glucose 102 mg, BUN 13, creatinine 1.05.  Total cholesterol 187, triglycerides 80, HDL 48, LDL 123.  TSH normal at 1.800.  Medications and allergies  No Known Allergies   Current Outpatient Medications  Medication Instructions  . aspirin (ASPIRIN LOW DOSE) 81 MG chewable tablet CHEW AND SWALLOW 1 TABLET BY MOUTH DAILY  . cholecalciferol (VITAMIN D) 5,000 Units, Oral, Every other day  . cyanocobalamin 5,000 mcg, Oral, Every other day  . glucose blood (ACCU-CHEK GUIDE) test strip Use as instructed to check blood sugar once a day  . metFORMIN (GLUCOPHAGE-XR) 500 MG 24 hr tablet TAKE 1 TABLET BY MOUTH ONCE DAILY AT DINNER  . metoprolol tartrate (LOPRESSOR) 25 mg, Oral, 2 times daily  . MICROLET LANCETS MISC Use 1x a day  . pantoprazole (PROTONIX) 40 mg, Oral, Daily, Please schedule appointment for refills  . rosuvastatin (CRESTOR) 20 mg, Oral, Daily   Radiology:   Coronary CTA 12/15/2017:  Coronary calcium score 25. Right dominant. Long Mid LAD 70% stenosis. FFR significant. LAD: Proximal CT FFR: 0.89, mid: 0.83, distal 0.76.   hemodynamically significant stenosis in the mid LAD Distal RCA 25-50% diffuse disease, circumflex made 25% stenosis.  Cardiac Studies:   Sleep Study  07/10/2016  Mild obstructive sleep apnea occurred during this study (AHI = 8.7/h). - No significant  central sleep apnea occurred during this study (CAI = 0.0/h). - Moderate oxygen desaturation was noted during this study (Min O2 = 86%). - Patient snored 2.0% during the sleep.  Treadmill exercise stress test 12/11/2017: Indication: Chest pain. The patient exercised on Bruce protocol for  10:00 min. Patient achieved  11.78 METS and reached HR  194 bpm, which is  113% of maximum age-predicted HR.  Stress test terminated due to fatigue. Chest Pain: non-limiting. BP Response to Exercise: Normal resting BP- appropriate response. Arrhythmias: none. HR Response to Exercise: Appropriate. ST Changes: With peak exercise there was down-sloping ST depression of 2 mm in the inferior leads and 2 mm upsloping ST depression in lateral leads which persisted for >2 minutes into recovery. This was positive for ischemia and associated with chest tightness which improved in rest. Overall Impression: Positive stress test typical of ischemia.  Consider furtner cardiac work-up.  Echocardiogram  12/23/2017: Left ventricle cavity is normal in size. Normal global wall motion. Normal diastolic filling pattern. Calculated EF 67%. No significant valvular abnormality.  EKG  EKG 01/14/2020: Sinus tachycardia at the rate of 108 bpm, borderline criteria for left leg enlargement, normal axis.  No evidence of ischemia.  Low-voltage complexes.  No significant change from 08/16/2018.  Low-voltage complexes noted previously as well.    Assessment     ICD-10-CM   1. Coronary artery disease of native artery of native heart with stable angina pectoris (HCC)  I25.118 EKG 12-Lead    metoprolol tartrate (LOPRESSOR) 25 MG tablet    CT CORONARY MORPH W/CTA COR W/SCORE W/CA W/CM &/OR  WO/CM    CT CORONARY FRACTIONAL FLOW RESERVE DATA PREP    CT CORONARY FRACTIONAL FLOW RESERVE FLUID ANALYSIS  2. Hypercholesteremia  E78.00   3. 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 Essential hypertension  I10 metoprolol tartrate (LOPRESSOR) 25 MG tablet  5. Hyperglycemia  R73.9   6. Family history of premature CAD- Father with CAD at age 8Y  Z41.49   7. Stable angina pectoris (HCC)  I20.8 CT CORONARY MORPH W/CTA COR W/SCORE W/CA W/CM &/OR WO/CM    CT CORONARY FRACTIONAL FLOW RESERVE DATA PREP    CT CORONARY FRACTIONAL FLOW RESERVE FLUID ANALYSIS     Meds ordered this encounter  Medications  . metoprolol tartrate (LOPRESSOR) 25 MG tablet    Sig: Take 1 tablet (25 mg total) by mouth 2 (two) times daily.    Dispense:  180 tablet    Refill:  3    Medications Discontinued During This Encounter  Medication Reason  . nitroGLYCERIN (NITROSTAT) 0.4 MG SL tablet Patient Preference  . NONFORMULARY OR COMPOUNDED ITEM No longer needed (for PRN medications)  . 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 Asian [place] Male with family history of coronary artery disease in his father at age 44 Y,  h/o prediabetes, nonspecific peripheral neuropathy, mild hyperlipidemia sometime in Jan 2019 started noticing tightness in the middle of the chest with exertional activity. 12/11/2017 and was able to exercise for 10 minutes and achieved 11.78 Mets, but had ischemic EKG along with chest pain for which he underwent coronary CTA on 12/15/2017, revealing calcium score of 25, high-grade stenosis in the mid LAD and diffuse disease involving all 3 major vessels. He has recommended aggressive medical therapy under started on statin along with aspirin and beta blocker.   Symptoms of chest pain are  indicative of class I to at most class II stable angina pectoris.  Question is whether with aggressive medical therapy he has progressed in disease  or we can still continue medical therapy and not invasive strategy.  I will obtain a CT angiogram and reevaluate.  With regard to hyperlipidemia, labs are pending, would try to achieve LDL goal closer to 55-60 in view of aggressive nature of the disease and also premature coronary artery disease in his father at age 25.  Also the LPA is markedly elevated. Hyperglycemia is an additional risk.  Blood pressure is well controlled otherwise, hopefully addition of metoprolol back would be helpful.  Will discontinue metoprolol succinate and try metoprolol tartrate and see if he has any GI side effects with this.  Otherwise on appropriate medical therapy, we discussed extensively regarding cardiac stress testing versus CT angiogram, arteritis, benefits, risks and diagnostic yield discussed in detail.  Unless significant abnormality by coronary CTA or symptoms continue to persist, I will see him back in a year.  Adrian Prows, MD, [facility] 01/15/2020, 8:04 AM [facility] [place] Office: [phone]

## 2020-01-17 ENCOUNTER — Other Ambulatory Visit: Payer: Self-pay | Admitting: Cardiology

## 2020-01-17 MED FILL — NITROGLYCERIN 0.4 MG TAB SL: 0.4 | 5 days supply | Qty: 25 | Fill #0

## 2020-01-27 ENCOUNTER — Other Ambulatory Visit (HOSPITAL_COMMUNITY): Payer: Self-pay | Admitting: Internal Medicine

## 2020-01-27 ENCOUNTER — Other Ambulatory Visit: Payer: Self-pay | Admitting: Internal Medicine

## 2020-01-27 ENCOUNTER — Other Ambulatory Visit: Payer: Self-pay | Admitting: Cardiology

## 2020-01-27 DIAGNOSIS — G4700 Insomnia, unspecified: Secondary | ICD-10-CM | POA: Diagnosis not present

## 2020-01-27 DIAGNOSIS — E782 Mixed hyperlipidemia: Secondary | ICD-10-CM | POA: Diagnosis not present

## 2020-01-27 DIAGNOSIS — R1013 Epigastric pain: Secondary | ICD-10-CM | POA: Diagnosis not present

## 2020-01-27 DIAGNOSIS — R7309 Other abnormal glucose: Secondary | ICD-10-CM | POA: Diagnosis not present

## 2020-01-27 DIAGNOSIS — I2510 Atherosclerotic heart disease of native coronary artery without angina pectoris: Secondary | ICD-10-CM | POA: Diagnosis not present

## 2020-01-27 DIAGNOSIS — Z0000 Encounter for general adult medical examination without abnormal findings: Secondary | ICD-10-CM | POA: Diagnosis not present

## 2020-01-27 MED FILL — metFORMIN HCL ER 500 MG TB2: 500 | 90 days supply | Qty: 90 | Fill #0

## 2020-01-27 MED FILL — ROSUVASTATIN CALCIUM 20 MG: 20 | 90 days supply | Qty: 90 | Fill #0

## 2020-01-27 MED FILL — NORTRIPTYLINE HCL 10 MG CAP: 10 | 30 days supply | Qty: 30 | Fill #0

## 2020-01-27 MED FILL — traZODone HCL 50 MG TABS: 50 | 90 days supply | Qty: 90 | Fill #0

## 2020-02-11 MED FILL — PANTOPRAZOLE SOD DR 40 MG T: 40 | 90 days supply | Qty: 180 | Fill #1

## 2020-02-18 ENCOUNTER — Telehealth: Payer: Self-pay

## 2020-02-21 ENCOUNTER — Other Ambulatory Visit: Payer: Self-pay | Admitting: Cardiology

## 2020-02-21 ENCOUNTER — Telehealth: Payer: Self-pay

## 2020-02-21 DIAGNOSIS — I10 Essential (primary) hypertension: Secondary | ICD-10-CM

## 2020-02-22 LAB — BASIC METABOLIC PANEL
BUN/Creatinine Ratio: 13 (ref 9–20)
BUN: 13 mg/dL (ref 6–24)
CO2: 24 mmol/L (ref 20–29)
Calcium: 10 mg/dL (ref 8.7–10.2)
Chloride: 102 mmol/L (ref 96–106)
Creatinine, Ser: 0.99 mg/dL (ref 0.76–1.27)
GFR calc Af Amer: 101 mL/min/{1.73_m2} (ref >59)
GFR calc non Af Amer: 87 mL/min/{1.73_m2} (ref >59)
Glucose: 118 mg/dL — ABNORMAL HIGH (ref 65–99)
Potassium: 4.4 mmol/L (ref 3.5–5.2)
Sodium: 142 mmol/L (ref 134–144)

## 2020-02-24 ENCOUNTER — Encounter (HOSPITAL_COMMUNITY): Payer: Self-pay

## 2020-02-24 ENCOUNTER — Telehealth (HOSPITAL_COMMUNITY): Payer: Self-pay | Admitting: Emergency Medicine

## 2020-02-24 NOTE — Telephone Encounter (Signed)
Attempted to call patient regarding upcoming cardiac CT appointment. °Left message on voicemail with name and callback number °Musa Rewerts RN Navigator Cardiac Imaging °[facility] °[phone] Office °[phone] Cell ° °

## 2020-02-24 NOTE — Telephone Encounter (Signed)
Pt returning phone call regarding upcoming cardiac imaging study; pt verbalizes understanding of appt date/time, parking situation and where to check in, pre-test NPO status and medications ordered, and verified current allergies; name and call back number provided for further questions should they arise Marchia Bond RN Navigator Cardiac Imaging [facility] [phone] office [phone] cell   Pt instructed to take 50mg  metoprolol tonight , 50mg  metoprolol tartrate 2 hr prior to CTA appt tomorrow.

## 2020-02-25 ENCOUNTER — Ambulatory Visit (HOSPITAL_COMMUNITY): Admission: RE | Admit: 2020-02-25 | Payer: 59 | Source: Ambulatory Visit

## 2020-03-06 ENCOUNTER — Telehealth (HOSPITAL_COMMUNITY): Payer: Self-pay | Admitting: *Deleted

## 2020-03-06 NOTE — Telephone Encounter (Signed)
Reaching out to patient to offer assistance regarding upcoming cardiac imaging study; pt verbalizes understanding of appt date/time, parking situation and where to check in, pre-test NPO status and medications ordered, and verified current allergies; name and call back number provided for further questions should they arise [facility] [phone] office [phone] cell  Pt is to take 43m metoprolol for a target HR less than 65bpm.

## 2020-03-07 ENCOUNTER — Other Ambulatory Visit (HOSPITAL_COMMUNITY): Payer: Self-pay | Admitting: Emergency Medicine

## 2020-03-07 ENCOUNTER — Ambulatory Visit (HOSPITAL_COMMUNITY)
Admission: RE | Admit: 2020-03-07 | Discharge: 2020-03-07 | Disposition: A | Discharge status: Home / Self Care | Payer: 59 | Source: Ambulatory Visit | Attending: Cardiology | Admitting: Cardiology

## 2020-03-07 ENCOUNTER — Other Ambulatory Visit: Payer: Self-pay

## 2020-03-07 DIAGNOSIS — I208 Other forms of angina pectoris: Secondary | ICD-10-CM | POA: Diagnosis not present

## 2020-03-07 DIAGNOSIS — R1011 Right upper quadrant pain: Secondary | ICD-10-CM | POA: Diagnosis not present

## 2020-03-07 DIAGNOSIS — I25118 Atherosclerotic heart disease of native coronary artery with other forms of angina pectoris: Secondary | ICD-10-CM | POA: Insufficient documentation

## 2020-03-07 DIAGNOSIS — K824 Cholesterolosis of gallbladder: Secondary | ICD-10-CM | POA: Diagnosis not present

## 2020-03-07 MED ORDER — NITROGLYCERIN 0.4 MG SL SUBL
0.8000 mg | SUBLINGUAL_TABLET | Freq: Once | SUBLINGUAL | Status: AC
  Administered 2020-03-07: 0.8 mg via SUBLINGUAL

## 2020-03-07 MED ORDER — IOHEXOL 350 MG/ML SOLN
80.0000 mL | Freq: Once | INTRAVENOUS | Status: AC | PRN
  Administered 2020-03-07: 80 mL via INTRAVENOUS

## 2020-03-07 MED ORDER — NITROGLYCERIN 0.4 MG SL SUBL
SUBLINGUAL_TABLET | SUBLINGUAL | Status: AC
  Filled 2020-03-07: qty 2

## 2020-03-07 NOTE — Progress Notes (Signed)
CT scan completed. Tolerated well. D/C home ambulatory, awake and alert. In no distress. 

## 2020-03-08 DIAGNOSIS — I25118 Atherosclerotic heart disease of native coronary artery with other forms of angina pectoris: Secondary | ICD-10-CM | POA: Diagnosis not present

## 2020-03-13 NOTE — Telephone Encounter (Signed)
Lab 01/12/2020:  Serum glucose 132 mg, BUN 19, creatinine 1.23, EGFR >60 mL, sodium 142, potassium 4.3.  CMP otherwise normal.  Total cholesterol 110, triglycerides 86, HDL 49, LDL 44.  CBC normal,  A1c 6.2%.

## 2020-03-22 ENCOUNTER — Ambulatory Visit: Payer: 59 | Admitting: Skilled Nursing Facility1

## 2020-03-23 ENCOUNTER — Other Ambulatory Visit: Payer: Self-pay | Admitting: Cardiology

## 2020-03-23 MED FILL — ASPIRIN LOW DOSE 81 MG CHEW: 81 | 90 days supply | Qty: 90 | Fill #0

## 2020-04-07 DIAGNOSIS — R1013 Epigastric pain: Secondary | ICD-10-CM | POA: Diagnosis not present

## 2020-04-19 ENCOUNTER — Ambulatory Visit: Payer: 59 | Admitting: Skilled Nursing Facility1

## 2020-05-05 MED FILL — ROSUVASTATIN CALCIUM 20 MG: 20 | 90 days supply | Qty: 90 | Fill #1

## 2020-05-05 MED FILL — metFORMIN HCL ER 500 MG TB2: 500 | 90 days supply | Qty: 90 | Fill #1

## 2020-05-17 ENCOUNTER — Ambulatory Visit: Payer: 59 | Admitting: Skilled Nursing Facility1

## 2020-06-07 MED FILL — ASPIRIN LOW DOSE 81 MG CHEW: 81 | 90 days supply | Qty: 90 | Fill #1

## 2020-06-15 DIAGNOSIS — Z111 Encounter for screening for respiratory tuberculosis: Secondary | ICD-10-CM | POA: Diagnosis not present

## 2020-06-15 DIAGNOSIS — Z113 Encounter for screening for infections with a predominantly sexual mode of transmission: Secondary | ICD-10-CM | POA: Diagnosis not present

## 2020-07-10 MED FILL — METOPROLOL TARTRATE 25 MG T: 25 | 90 days supply | Qty: 180 | Fill #2

## 2020-08-04 MED FILL — metFORMIN HCL ER 500 MG TB2: 500 | 90 days supply | Qty: 90 | Fill #2

## 2020-08-04 MED FILL — ROSUVASTATIN CALCIUM 20 MG: 20 | 90 days supply | Qty: 90 | Fill #2

## 2020-09-11 MED FILL — ASPIRIN LOW DOSE 81 MG CHEW: 81 | 90 days supply | Qty: 90 | Fill #2

## 2020-10-09 MED FILL — PANTOPRAZOLE SOD DR 40 MG T: 40 | 90 days supply | Qty: 180 | Fill #2

## 2020-10-09 MED FILL — METOPROLOL TARTRATE 25 MG T: 25 | 90 days supply | Qty: 180 | Fill #3

## 2020-11-06 MED FILL — metFORMIN HCL ER 500 MG TB2: 500 | 90 days supply | Qty: 90 | Fill #3

## 2020-11-06 MED FILL — ROSUVASTATIN CALCIUM 20 MG: 20 | 90 days supply | Qty: 90 | Fill #3

## 2020-12-05 MED FILL — ASPIRIN LOW DOSE 81 MG CHEW: 81 | 90 days supply | Qty: 90 | Fill #3

## 2020-12-29 ENCOUNTER — Other Ambulatory Visit (HOSPITAL_COMMUNITY): Payer: Self-pay | Admitting: Orthopedic Surgery

## 2020-12-29 DIAGNOSIS — M7501 Adhesive capsulitis of right shoulder: Secondary | ICD-10-CM | POA: Diagnosis not present

## 2020-12-29 MED FILL — MELOXICAM 15 MG TABLET: 15 | 30 days supply | Qty: 30 | Fill #0

## 2021-01-08 ENCOUNTER — Other Ambulatory Visit: Payer: Self-pay | Admitting: Cardiology

## 2021-01-08 DIAGNOSIS — I10 Essential (primary) hypertension: Secondary | ICD-10-CM

## 2021-01-08 DIAGNOSIS — I25118 Atherosclerotic heart disease of native coronary artery with other forms of angina pectoris: Secondary | ICD-10-CM

## 2021-01-09 ENCOUNTER — Other Ambulatory Visit: Payer: Self-pay | Admitting: Cardiology

## 2021-01-12 ENCOUNTER — Ambulatory Visit: Payer: 59 | Admitting: Cardiology

## 2021-01-19 ENCOUNTER — Other Ambulatory Visit (HOSPITAL_BASED_OUTPATIENT_CLINIC_OR_DEPARTMENT_OTHER): Payer: Self-pay

## 2021-01-29 ENCOUNTER — Other Ambulatory Visit (HOSPITAL_COMMUNITY): Payer: Self-pay

## 2021-01-29 ENCOUNTER — Other Ambulatory Visit: Payer: Self-pay | Admitting: Cardiology

## 2021-01-29 MED ORDER — METFORMIN HCL ER 500 MG PO TB24
ORAL_TABLET | ORAL | 4 refills | Status: DC
  Filled 2021-01-29: qty 90, 90d supply, fill #0
  Filled 2021-04-24: qty 90, 90d supply, fill #1
  Filled 2021-07-26: qty 90, 90d supply, fill #2
  Filled 2021-11-05: qty 90, 90d supply, fill #3

## 2021-01-29 MED ORDER — ROSUVASTATIN CALCIUM 20 MG PO TABS
ORAL_TABLET | Freq: Every day | ORAL | 3 refills | Status: DC
  Filled 2021-01-29: qty 90, 90d supply, fill #0
  Filled 2021-04-24: qty 90, 90d supply, fill #1
  Filled 2021-07-26: qty 90, 90d supply, fill #2
  Filled 2021-11-05: qty 90, 90d supply, fill #3

## 2021-01-30 ENCOUNTER — Other Ambulatory Visit (HOSPITAL_COMMUNITY): Payer: Self-pay

## 2021-01-30 MED ORDER — METFORMIN HCL ER 500 MG PO TB24
500.0000 mg | ORAL_TABLET | ORAL | 4 refills | Status: DC
  Filled 2021-01-30: qty 90, 90d supply, fill #0

## 2021-02-07 ENCOUNTER — Other Ambulatory Visit (HOSPITAL_COMMUNITY): Payer: Self-pay

## 2021-02-21 ENCOUNTER — Other Ambulatory Visit (HOSPITAL_COMMUNITY): Payer: Self-pay

## 2021-02-21 ENCOUNTER — Other Ambulatory Visit: Payer: Self-pay

## 2021-02-21 ENCOUNTER — Encounter: Payer: Self-pay | Admitting: Cardiology

## 2021-02-21 ENCOUNTER — Ambulatory Visit: Payer: 59 | Admitting: Cardiology

## 2021-02-21 VITALS — BP 96/60 | HR 76 | Temp 98.0°F | Resp 17 | Ht 72.0 in | Wt 207.0 lb

## 2021-02-21 DIAGNOSIS — I2510 Atherosclerotic heart disease of native coronary artery without angina pectoris: Secondary | ICD-10-CM | POA: Diagnosis not present

## 2021-02-21 DIAGNOSIS — E782 Mixed hyperlipidemia: Secondary | ICD-10-CM | POA: Diagnosis not present

## 2021-02-21 DIAGNOSIS — F419 Anxiety disorder, unspecified: Secondary | ICD-10-CM | POA: Diagnosis not present

## 2021-02-21 DIAGNOSIS — E7841 Elevated Lipoprotein(a): Secondary | ICD-10-CM

## 2021-02-21 DIAGNOSIS — I10 Essential (primary) hypertension: Secondary | ICD-10-CM | POA: Diagnosis not present

## 2021-02-21 DIAGNOSIS — E7800 Pure hypercholesterolemia, unspecified: Secondary | ICD-10-CM

## 2021-02-21 DIAGNOSIS — R7309 Other abnormal glucose: Secondary | ICD-10-CM | POA: Diagnosis not present

## 2021-02-21 DIAGNOSIS — Z0000 Encounter for general adult medical examination without abnormal findings: Secondary | ICD-10-CM | POA: Diagnosis not present

## 2021-02-21 DIAGNOSIS — F320 Major depressive disorder, single episode, mild: Secondary | ICD-10-CM | POA: Diagnosis not present

## 2021-02-21 MED ORDER — ESCITALOPRAM OXALATE 10 MG PO TABS
ORAL_TABLET | ORAL | 9 refills | Status: DC
  Filled 2021-02-21: qty 30, 30d supply, fill #0
  Filled 2021-03-27: qty 30, 30d supply, fill #1
  Filled 2021-04-24: qty 30, 30d supply, fill #2
  Filled 2021-06-01: qty 30, 30d supply, fill #3
  Filled 2021-07-26: qty 30, 30d supply, fill #4
  Filled 2021-09-18: qty 30, 30d supply, fill #5
  Filled 2021-11-05: qty 30, 30d supply, fill #6
  Filled 2022-01-29: qty 30, 30d supply, fill #7

## 2021-02-21 NOTE — Progress Notes (Signed)
Primary Physician/Referring:  Merrilee Seashore, MD  Patient ID: Dustin [name], male    DOB: [birth year], 55 y.o.   MRN: [id number]  Chief Complaint  Patient presents with  . Coronary Artery Disease  . 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 Asian [place] Male with family history of coronary artery disease in his father at age 60Y,  h/o prediabetes, nonspecific peripheral neuropathy, hyperlipidemia, CAD with CTA on 12/15/2017, revealing high-grade stenosis in the mid LAD and diffuse disease involving all 3 major vessels. He has recommended aggressive medical therapy under started on statin along with aspirin and beta blocker.   Since being on aggressive medical therapy, he has not had any recurrence of angina pectoris.  He is tolerating all his medications well.  Lately he has been under extremes of stress due to family health issues and since then has noticed mild anxiety and depression and also forgetfulness.  He is accompanied by his wife today.  Past Medical History:  Diagnosis Date  . Dysesthesia of multiple sites 01/16/2016   Fluctuating Hot & cold sensation primarily legs, sometimes back or saddle area since 11/16  . Impaired glucose tolerance   . 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 Vision abnormalities   . Vitamin deficiency    Past Surgical History:  Procedure Laterality Date  . TONSILLECTOMY     Family History  Problem Relation Age of Onset  . Other Mother        other  . Diabetes type II Father   . Coronary artery disease Father   . Healthy Brother   . Healthy Son   . Healthy Daughter   . Diabetes Other        Paternal aunt  . CAD Other        Paternal uncle    Social History   Tobacco Use  . Smoking status: Never Smoker  . Smokeless tobacco: Never Used  Substance Use Topics  . Alcohol use: Yes    Alcohol/week: 0.0 standard drinks    Comment: occasionally, 1-2 scotch 3-4 times monthly   ROS  Review of Systems   Cardiovascular: Negative for chest pain, dyspnea on exertion and leg swelling.  Gastrointestinal: Negative for melena.   Objective  Blood pressure 96/60, pulse 76, temperature 98 F (36.7 C), temperature source Temporal, resp. rate 17, height 6' (1.829 m), weight 207 lb (93.9 kg), SpO2 99 %.  Vitals with BMI 02/21/2021 03/07/2020 03/07/2020  Height '6\' 0"'  - -  Weight 207 lbs - -  BMI 21.03 - -  Systolic 96 128 118  Diastolic 60 77 78  Pulse 76 - -     Physical Exam Cardiovascular:     Rate and Rhythm: Normal rate and regular rhythm.     Pulses: Intact distal pulses.     Heart sounds: Normal heart sounds. No murmur heard. No gallop.      Comments: No leg edema, no JVD. Pulmonary:     Effort: Pulmonary effort is normal.     Breath sounds: Normal breath sounds.  Abdominal:     General: Bowel sounds are normal.     Palpations: Abdomen is soft.    Laboratory examination:   No results for input(s): NA, K, CL, CO2, GLUCOSE, BUN, CREATININE, CALCIUM, GFRNONAA, GFRAA in the last 8760 hours. CrCl cannot be calculated (Patient's most recent lab result is older than the maximum 21 days allowed.).  CMP Latest Ref  Rng & Units 02/21/2020 06/15/2019 12/29/2015  Glucose 65 - 99 mg/dL 118(H) 85 98  BUN 6 - 24 mg/dL '13 13 20  ' Creatinine 0.76 - 1.27 mg/dL 0.99 1.01 1.10  Sodium 134 - 144 mmol/L 142 142 140  Potassium 3.5 - 5.2 mmol/L 4.4 4.1 4.1  Chloride 96 - 106 mmol/L 102 100 105  CO2 20 - 29 mmol/L '24 23 30  ' Calcium 8.7 - 10.2 mg/dL 10.0 9.8 9.4  Total Protein 6.0 - 8.5 g/dL - 7.2 7.1  Total Bilirubin 0.0 - 1.2 mg/dL - 0.4 0.5  Alkaline Phos 39 - 117 IU/L - 64 64  AST 0 - 40 IU/L - 20 21  ALT 0 - 44 IU/L - 24 44   CBC Latest Ref Rng & Units 06/07/2019 01/15/2016 12/29/2015  WBC 3.4 - 10.8 x10E3/uL 7.0 6.6 5.9  Hemoglobin 13.0 - 17.7 g/dL 16.0 17.1 17.5(H)  Hematocrit 37.5 - 51.0 % 50.5 50.6 53.1(H)  Platelets 150 - 450 x10E3/uL 220 273 230.0     Lipid Panel     Component Value  Date/Time   CHOL 145 06/07/2019 0918   TRIG 118 06/07/2019 0918   HDL 47 06/07/2019 0918   CHOLHDL 4 05/30/2016 0845   VLDL 30.2 05/30/2016 0845   LDLCALC 74 06/07/2019 0918   LDLDIRECT 79 06/07/2019 0918        Lipoprotein a        126.                                   12/29/2017  HEMOGLOBIN A1C Lab Results  Component Value Date   HGBA1C 5.9 (H) 06/07/2019   TSH No results for input(s): TSH in the last 8760 hours. External labs:   Labs 01/23/2021:  Hb 17.1/HCT 51.0, platelets 292.  Normal indicis.  A1c 6.1%.  TSH minimally elevated at 4.640.  PSA normal at 0.8.  Serum glucose 1 132 mg, BUN 15, creatinine 1.08, EGFR 83 mL, potassium 4.9.  CMP otherwise normal.  Total cholesterol 127, triglycerides 142, HDL 45, LDL 57.    Labs 12/12/2017: HB 16.8/HCT 49.3, platelets 206, indicis normal.  Serum glucose 102 mg, BUN 13, creatinine 1.05.  Total cholesterol 187, triglycerides 80, HDL 48, LDL 123.  TSH normal at 1.800.  Medications and allergies  No Known Allergies   Current Outpatient Medications on File Prior to Visit  Medication Sig Dispense Refill  . aspirin 81 MG chewable tablet CHEW AND SWALLOW 1 TABLET BY MOUTH DAILY 90 tablet 3  . cholecalciferol (VITAMIN D) 1000 UNITS tablet Take 5,000 Units by mouth every other day.    . cyanocobalamin 1000 MCG tablet Take 5,000 mcg by mouth every other day.    Marland Kitchen glucose blood (ACCU-CHEK GUIDE) test strip Use as instructed to check blood sugar once a day 50 each 12  . meloxicam (MOBIC) 15 MG tablet TAKE 1 TABLET BY MOUTH ONCE A DAY FOR 2 WEEKS, THEN ONLY AS NEEDED AFTERWARDS 30 tablet 2  . metFORMIN (GLUCOPHAGE-XR) 500 MG 24 hr tablet TAKE 1 TABLET BY MOUTH ONCE DAILY AT DINNER 90 tablet 3  . metoprolol tartrate (LOPRESSOR) 25 MG tablet TAKE 1 TABLET BY MOUTH TWICE DAILY 180 tablet 3  . MICROLET LANCETS MISC Use 1x a day 100 each 3  . nitroGLYCERIN (NITROSTAT) 0.4 MG SL tablet DISSOLVE 1 TABLET BY MOUTH EVERY 5 MINUTES AS NEEDED FOR  CHEST PAIN 25 tablet  3  . pantoprazole (PROTONIX) 40 MG tablet Take 1 tablet (40 mg total) by mouth daily. Please schedule appointment for refills (Patient taking differently: Take 40 mg by mouth 2 (two) times daily. Please schedule appointment for refills) 30 tablet 0  . rosuvastatin (CRESTOR) 20 MG tablet TAKE 1 TABLET BY MOUTH ONCE DAILY 90 tablet 3   No current facility-administered medications on file prior to visit.    Radiology:   Coronary CTA 12/15/2017:  Coronary calcium score 25. Right dominant. Long Mid LAD 70% stenosis. FFR significant. LAD: Proximal CT FFR: 0.89, mid: 0.83, distal 0.76.  hemodynamically significant stenosis in the mid LAD Distal RCA 25-50% diffuse disease, circumflex made 25% stenosis.  Coronary CT morphology 03/08/2020: 1. Coronary calcium score of 41.7. This was 108 percentile for age and sex matched control. Previously calcium score of 25 (78 percentile for age and sex matched control). 2. Normal coronary origin with right dominance. 3. CAD-RADS 3. Moderate stenosis in the mid LAD, this is a long diffuse lesion, similar to findings in 2019. Consider intensifying lipid management. Additional analysis with CT FFR will be submitted.  1. Left Main: 0.95. 2. LAD: Proximal; 0.86, mid: 0.89, distal: 0.84. 3. LCX: 0.90. 4. OM1: Proximal; 0.87, distal; 0.83. 5. OM3: 0.87. 6. RCA: 0.97.  IMPRESSION: 1. CT FFR analysis didn't show any significant stenosis. Aggressive medical management is recommended.  Cardiac Studies:   Sleep Study  07/10/2016  Mild obstructive sleep apnea occurred during this study (AHI = 8.7/h). - No significant central sleep apnea occurred during this study (CAI = 0.0/h). - Moderate oxygen desaturation was noted during this study (Min O2 = 86%). - Patient snored 2.0% during the sleep.  Treadmill exercise stress test 12/11/2017: Indication: Chest pain. The patient exercised on Bruce protocol for  10:00 min. Patient achieved  11.78  METS and reached HR  194 bpm, which is  113% of maximum age-predicted HR.  Stress test terminated due to fatigue. Chest Pain: non-limiting. BP Response to Exercise: Normal resting BP- appropriate response. Arrhythmias: none. HR Response to Exercise: Appropriate. ST Changes: With peak exercise there was down-sloping ST depression of 2 mm in the inferior leads and 2 mm upsloping ST depression in lateral leads which persisted for >2 minutes into recovery. This was positive for ischemia and associated with chest tightness which improved in rest. Overall Impression: Positive stress test typical of ischemia.  Consider furtner cardiac work-up.  Echocardiogram  12/23/2017: Left ventricle cavity is normal in size. Normal global wall motion. Normal diastolic filling pattern. Calculated EF 67%. No significant valvular abnormality.  EKG  EKG 02/21/2021: Normal sinus rhythm at rate of 72 bpm, normal axis.  Low-voltage complexes otherwise normal EKG.   No significant change from EKG 01/14/2020: Sinus tachycardia at the rate of 108 bpm not present..    Assessment     ICD-10-CM   1. Coronary artery disease involving native coronary artery of native heart without angina pectoris  I25.10   2. Essential hypertension  I10 EKG 12-Lead  3. Hypercholesteremia  E78.00   4. 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   Medications Discontinued During This Encounter  Medication Reason  . metFORMIN (GLUCOPHAGE-XR) 500 MG 24 hr tablet Error  . metFORMIN (GLUCOPHAGE-XR) 500 MG 24 hr tablet Error  . 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 [place]  Male with family history of coronary artery disease in his father at age 57Y,  h/o prediabetes, nonspecific peripheral neuropathy, hyperlipidemia, CAD with CTA on 12/15/2017, revealing high-grade stenosis in the mid LAD and diffuse disease involving all 3  major vessels. He has recommended aggressive medical therapy under started on statin along with aspirin and beta blocker.   Since being on aggressive medical therapy, he has not had any recurrence of angina pectoris.  He is tolerating all his medications well.  He has been under extremes of stress due to family health issues and has noticed mild depression and anxiety.  He has been recommended to start Lexapro.  I reviewed his external labs, renal function and CBC are normal and lipids are under excellent control.  Although he has elevated LPA, there is no therapy for now, as long as LDL is well controlled, and he remains asymptomatic, continue present medical therapy.  I will see him back on an annual basis.  Otherwise he remains asymptomatic without recurrence of angina, blood pressure is also well controlled.  No changes in the medications were done today.  External labs reviewed.   Adrian Prows, MD, [facility] 02/21/2021, 4:54 PM Office: [phone] Pager: [phone]

## 2021-02-22 ENCOUNTER — Other Ambulatory Visit (HOSPITAL_COMMUNITY): Payer: Self-pay

## 2021-02-22 ENCOUNTER — Other Ambulatory Visit: Payer: Self-pay | Admitting: Cardiology

## 2021-02-22 MED ORDER — NITROGLYCERIN 0.4 MG SL SUBL
SUBLINGUAL_TABLET | SUBLINGUAL | 3 refills | Status: DC
  Filled 2021-02-22: qty 25, 5d supply, fill #0
  Filled 2022-02-19: qty 25, 5d supply, fill #1

## 2021-03-27 ENCOUNTER — Other Ambulatory Visit (HOSPITAL_COMMUNITY): Payer: Self-pay

## 2021-04-24 ENCOUNTER — Other Ambulatory Visit: Payer: Self-pay | Admitting: Cardiology

## 2021-04-24 ENCOUNTER — Other Ambulatory Visit (HOSPITAL_COMMUNITY): Payer: Self-pay

## 2021-04-24 MED ORDER — ASPIRIN 81 MG PO CHEW
CHEWABLE_TABLET | ORAL | 3 refills | Status: DC
  Filled 2021-04-24: qty 90, 90d supply, fill #0
  Filled 2021-07-26: qty 90, 90d supply, fill #1
  Filled 2021-11-05: qty 90, 90d supply, fill #2
  Filled 2022-01-29: qty 90, 90d supply, fill #3

## 2021-04-24 MED FILL — Metoprolol Tartrate Tab 25 MG: ORAL | 90 days supply | Qty: 180 | Fill #0 | Status: AC

## 2021-05-04 ENCOUNTER — Other Ambulatory Visit (HOSPITAL_COMMUNITY): Payer: Self-pay

## 2021-06-01 ENCOUNTER — Other Ambulatory Visit (HOSPITAL_COMMUNITY): Payer: Self-pay

## 2021-07-26 ENCOUNTER — Other Ambulatory Visit (HOSPITAL_COMMUNITY): Payer: Self-pay

## 2021-07-26 MED FILL — Metoprolol Tartrate Tab 25 MG: ORAL | 90 days supply | Qty: 180 | Fill #1 | Status: AC

## 2021-09-05 ENCOUNTER — Ambulatory Visit: Payer: 59 | Attending: Internal Medicine

## 2021-09-05 ENCOUNTER — Other Ambulatory Visit (HOSPITAL_BASED_OUTPATIENT_CLINIC_OR_DEPARTMENT_OTHER): Payer: Self-pay

## 2021-09-05 DIAGNOSIS — Z23 Encounter for immunization: Secondary | ICD-10-CM

## 2021-09-05 MED ORDER — PFIZER COVID-19 VAC BIVALENT 30 MCG/0.3ML IM SUSP
INTRAMUSCULAR | 0 refills | Status: DC
  Filled 2021-09-05: qty 0.3, 1d supply, fill #0

## 2021-09-05 NOTE — Progress Notes (Signed)
   Covid-19 Vaccination Clinic  Name:  DHILLON COMUNALE    MRN: [id number] DOB: 1966/06/24  09/05/2021  Mr. Kozicki was observed post Covid-19 immunization for 15 minutes without incident. He was provided with Vaccine Information Sheet and instruction to access the V-Safe system.   Mr. Wehner was instructed to call [phone] with any severe reactions post vaccine: Difficulty breathing  Swelling of face and throat  A fast heartbeat  A bad rash all over body  Dizziness and weakness   Immunizations Administered     Name Date Dose VIS Date Route   Pfizer Covid-19 Vaccine Bivalent Booster 09/05/2021 10:10 AM 0.3 mL 06/27/2021 Intramuscular   Manufacturer: [employer]   Lot: [id number]   [place]: [phone]

## 2021-09-10 DIAGNOSIS — H524 Presbyopia: Secondary | ICD-10-CM | POA: Diagnosis not present

## 2021-09-10 DIAGNOSIS — H52203 Unspecified astigmatism, bilateral: Secondary | ICD-10-CM | POA: Diagnosis not present

## 2021-09-10 DIAGNOSIS — H5203 Hypermetropia, bilateral: Secondary | ICD-10-CM | POA: Diagnosis not present

## 2021-09-18 ENCOUNTER — Other Ambulatory Visit (HOSPITAL_COMMUNITY): Payer: Self-pay

## 2021-10-30 DIAGNOSIS — H903 Sensorineural hearing loss, bilateral: Secondary | ICD-10-CM | POA: Diagnosis not present

## 2021-10-30 DIAGNOSIS — J310 Chronic rhinitis: Secondary | ICD-10-CM | POA: Diagnosis not present

## 2021-10-30 DIAGNOSIS — H6121 Impacted cerumen, right ear: Secondary | ICD-10-CM | POA: Diagnosis not present

## 2021-11-05 ENCOUNTER — Other Ambulatory Visit (HOSPITAL_COMMUNITY): Payer: Self-pay

## 2021-11-05 MED FILL — Metoprolol Tartrate Tab 25 MG: ORAL | 90 days supply | Qty: 180 | Fill #2 | Status: AC

## 2021-11-06 ENCOUNTER — Other Ambulatory Visit (HOSPITAL_COMMUNITY): Payer: Self-pay

## 2021-11-14 ENCOUNTER — Other Ambulatory Visit (HOSPITAL_COMMUNITY): Payer: Self-pay

## 2021-11-15 ENCOUNTER — Other Ambulatory Visit (HOSPITAL_COMMUNITY): Payer: Self-pay

## 2021-11-15 MED ORDER — PANTOPRAZOLE SODIUM 40 MG PO TBEC
DELAYED_RELEASE_TABLET | ORAL | 6 refills | Status: DC
  Filled 2021-11-15: qty 30, 30d supply, fill #0
  Filled 2022-11-02: qty 30, 30d supply, fill #1

## 2021-11-16 ENCOUNTER — Other Ambulatory Visit (HOSPITAL_COMMUNITY): Payer: Self-pay

## 2021-11-16 DIAGNOSIS — C44209 Unspecified malignant neoplasm of skin of left ear and external auricular canal: Secondary | ICD-10-CM | POA: Diagnosis not present

## 2021-11-16 DIAGNOSIS — G44209 Tension-type headache, unspecified, not intractable: Secondary | ICD-10-CM | POA: Diagnosis not present

## 2021-11-16 MED ORDER — CYCLOBENZAPRINE HCL 10 MG PO TABS
10.0000 mg | ORAL_TABLET | Freq: Three times a day (TID) | ORAL | 3 refills | Status: DC
  Filled 2021-11-16: qty 60, 20d supply, fill #0
  Filled 2022-11-02: qty 60, 20d supply, fill #1

## 2021-11-16 MED ORDER — HYDROCODONE-ACETAMINOPHEN 5-325 MG PO TABS
ORAL_TABLET | ORAL | 0 refills | Status: DC
  Filled 2021-11-16: qty 30, 4d supply, fill #0

## 2021-11-20 DIAGNOSIS — I2510 Atherosclerotic heart disease of native coronary artery without angina pectoris: Secondary | ICD-10-CM | POA: Diagnosis not present

## 2021-11-20 DIAGNOSIS — E782 Mixed hyperlipidemia: Secondary | ICD-10-CM | POA: Diagnosis not present

## 2021-11-20 DIAGNOSIS — M791 Myalgia, unspecified site: Secondary | ICD-10-CM | POA: Diagnosis not present

## 2021-11-20 DIAGNOSIS — E0590 Thyrotoxicosis, unspecified without thyrotoxic crisis or storm: Secondary | ICD-10-CM | POA: Diagnosis not present

## 2021-11-20 DIAGNOSIS — R5383 Other fatigue: Secondary | ICD-10-CM | POA: Diagnosis not present

## 2021-11-20 DIAGNOSIS — R7309 Other abnormal glucose: Secondary | ICD-10-CM | POA: Diagnosis not present

## 2021-11-28 ENCOUNTER — Other Ambulatory Visit: Payer: Self-pay | Admitting: Internal Medicine

## 2021-11-28 DIAGNOSIS — R519 Headache, unspecified: Secondary | ICD-10-CM

## 2021-11-28 DIAGNOSIS — R110 Nausea: Secondary | ICD-10-CM

## 2021-11-28 DIAGNOSIS — R270 Ataxia, unspecified: Secondary | ICD-10-CM

## 2021-12-04 ENCOUNTER — Ambulatory Visit
Admission: RE | Admit: 2021-12-04 | Discharge: 2021-12-04 | Disposition: A | Discharge status: Home / Self Care | Payer: 59 | Source: Ambulatory Visit | Attending: Internal Medicine | Admitting: Internal Medicine

## 2021-12-04 ENCOUNTER — Other Ambulatory Visit: Payer: Self-pay

## 2021-12-04 DIAGNOSIS — J329 Chronic sinusitis, unspecified: Secondary | ICD-10-CM | POA: Diagnosis not present

## 2021-12-04 DIAGNOSIS — R270 Ataxia, unspecified: Secondary | ICD-10-CM

## 2021-12-04 DIAGNOSIS — R110 Nausea: Secondary | ICD-10-CM

## 2021-12-04 DIAGNOSIS — R519 Headache, unspecified: Secondary | ICD-10-CM | POA: Diagnosis not present

## 2021-12-04 DIAGNOSIS — J341 Cyst and mucocele of nose and nasal sinus: Secondary | ICD-10-CM | POA: Diagnosis not present

## 2021-12-17 ENCOUNTER — Other Ambulatory Visit (HOSPITAL_COMMUNITY): Payer: Self-pay

## 2021-12-17 MED ORDER — METFORMIN HCL ER 500 MG PO TB24
ORAL_TABLET | ORAL | 3 refills | Status: DC
  Filled 2021-12-17 – 2021-12-31 (×2): qty 180, 90d supply, fill #0
  Filled 2022-03-20: qty 180, 90d supply, fill #1
  Filled 2022-07-16: qty 180, 90d supply, fill #2
  Filled 2022-10-07: qty 180, 90d supply, fill #3

## 2021-12-31 ENCOUNTER — Other Ambulatory Visit (HOSPITAL_COMMUNITY): Payer: Self-pay

## 2022-01-29 ENCOUNTER — Other Ambulatory Visit: Payer: Self-pay | Admitting: Cardiology

## 2022-01-29 ENCOUNTER — Other Ambulatory Visit (HOSPITAL_COMMUNITY): Payer: Self-pay

## 2022-01-29 DIAGNOSIS — I10 Essential (primary) hypertension: Secondary | ICD-10-CM

## 2022-01-29 DIAGNOSIS — I25118 Atherosclerotic heart disease of native coronary artery with other forms of angina pectoris: Secondary | ICD-10-CM

## 2022-01-30 ENCOUNTER — Other Ambulatory Visit (HOSPITAL_COMMUNITY): Payer: Self-pay

## 2022-02-19 ENCOUNTER — Other Ambulatory Visit: Payer: Self-pay | Admitting: Cardiology

## 2022-02-19 ENCOUNTER — Other Ambulatory Visit: Payer: Self-pay | Admitting: Student

## 2022-02-19 ENCOUNTER — Other Ambulatory Visit (HOSPITAL_COMMUNITY): Payer: Self-pay

## 2022-02-19 DIAGNOSIS — I25118 Atherosclerotic heart disease of native coronary artery with other forms of angina pectoris: Secondary | ICD-10-CM

## 2022-02-19 DIAGNOSIS — I10 Essential (primary) hypertension: Secondary | ICD-10-CM

## 2022-02-19 DIAGNOSIS — E7800 Pure hypercholesterolemia, unspecified: Secondary | ICD-10-CM

## 2022-02-19 DIAGNOSIS — I2510 Atherosclerotic heart disease of native coronary artery without angina pectoris: Secondary | ICD-10-CM

## 2022-02-19 MED ORDER — ROSUVASTATIN CALCIUM 20 MG PO TABS
20.0000 mg | ORAL_TABLET | Freq: Every day | ORAL | 3 refills | Status: DC
  Filled 2022-02-19: qty 90, 90d supply, fill #0
  Filled 2022-05-16: qty 90, 90d supply, fill #1
  Filled 2022-07-16 – 2022-07-27 (×2): qty 90, 90d supply, fill #2
  Filled 2022-11-02: qty 90, 90d supply, fill #3

## 2022-02-19 MED ORDER — ASPIRIN 81 MG PO CHEW
CHEWABLE_TABLET | ORAL | 3 refills | Status: DC
  Filled 2022-02-19: qty 90, fill #0
  Filled 2022-03-20: qty 90, 90d supply, fill #0
  Filled 2022-07-16: qty 90, 90d supply, fill #1
  Filled 2022-11-02: qty 90, 90d supply, fill #2

## 2022-02-19 MED ORDER — METOPROLOL TARTRATE 25 MG PO TABS
ORAL_TABLET | Freq: Two times a day (BID) | ORAL | 3 refills | Status: DC
  Filled 2022-02-19: qty 180, 90d supply, fill #0
  Filled 2022-05-16: qty 180, 90d supply, fill #1
  Filled 2022-07-16 – 2022-07-27 (×2): qty 180, 90d supply, fill #2
  Filled 2022-11-02: qty 180, 90d supply, fill #3

## 2022-02-20 ENCOUNTER — Other Ambulatory Visit (HOSPITAL_COMMUNITY): Payer: Self-pay

## 2022-03-20 ENCOUNTER — Other Ambulatory Visit (HOSPITAL_COMMUNITY): Payer: Self-pay

## 2022-03-20 MED ORDER — ESCITALOPRAM OXALATE 10 MG PO TABS
ORAL_TABLET | ORAL | 9 refills | Status: DC
Start: 2022-03-20 — End: 2023-03-31
  Filled 2022-03-20: qty 30, 30d supply, fill #0
  Filled 2022-05-16: qty 30, 30d supply, fill #1
  Filled 2022-07-16: qty 30, 30d supply, fill #2
  Filled 2022-10-07: qty 30, 30d supply, fill #3
  Filled 2022-11-02: qty 30, 30d supply, fill #4
  Filled 2023-01-09: qty 30, 30d supply, fill #5

## 2022-03-21 ENCOUNTER — Other Ambulatory Visit (HOSPITAL_COMMUNITY): Payer: Self-pay

## 2022-05-16 ENCOUNTER — Other Ambulatory Visit (HOSPITAL_COMMUNITY): Payer: Self-pay

## 2022-06-13 DIAGNOSIS — F320 Major depressive disorder, single episode, mild: Secondary | ICD-10-CM | POA: Diagnosis not present

## 2022-06-13 DIAGNOSIS — F419 Anxiety disorder, unspecified: Secondary | ICD-10-CM | POA: Diagnosis not present

## 2022-06-13 DIAGNOSIS — R7309 Other abnormal glucose: Secondary | ICD-10-CM | POA: Diagnosis not present

## 2022-06-13 DIAGNOSIS — I2510 Atherosclerotic heart disease of native coronary artery without angina pectoris: Secondary | ICD-10-CM | POA: Diagnosis not present

## 2022-06-13 DIAGNOSIS — E782 Mixed hyperlipidemia: Secondary | ICD-10-CM | POA: Diagnosis not present

## 2022-06-13 DIAGNOSIS — Z0000 Encounter for general adult medical examination without abnormal findings: Secondary | ICD-10-CM | POA: Diagnosis not present

## 2022-06-14 ENCOUNTER — Other Ambulatory Visit (HOSPITAL_COMMUNITY): Payer: Self-pay

## 2022-06-14 MED ORDER — MELOXICAM 15 MG PO TABS
ORAL_TABLET | ORAL | 9 refills | Status: DC
  Filled 2022-06-14: qty 30, 30d supply, fill #0
  Filled 2022-11-02: qty 30, 30d supply, fill #1

## 2022-06-26 ENCOUNTER — Other Ambulatory Visit (HOSPITAL_COMMUNITY): Payer: Self-pay

## 2022-06-26 DIAGNOSIS — L603 Nail dystrophy: Secondary | ICD-10-CM | POA: Diagnosis not present

## 2022-06-26 DIAGNOSIS — L738 Other specified follicular disorders: Secondary | ICD-10-CM | POA: Diagnosis not present

## 2022-06-26 DIAGNOSIS — D2271 Melanocytic nevi of right lower limb, including hip: Secondary | ICD-10-CM | POA: Diagnosis not present

## 2022-06-26 MED ORDER — CLINDAMYCIN PHOSPHATE 1 % EX SOLN
CUTANEOUS | 11 refills | Status: DC
  Filled 2022-06-26: qty 60, 30d supply, fill #0

## 2022-06-28 ENCOUNTER — Other Ambulatory Visit: Payer: Self-pay | Admitting: Orthopedic Surgery

## 2022-06-28 ENCOUNTER — Other Ambulatory Visit (HOSPITAL_COMMUNITY): Payer: Self-pay

## 2022-06-28 DIAGNOSIS — M25562 Pain in left knee: Secondary | ICD-10-CM | POA: Diagnosis not present

## 2022-06-28 DIAGNOSIS — S83289A Other tear of lateral meniscus, current injury, unspecified knee, initial encounter: Secondary | ICD-10-CM

## 2022-06-28 MED ORDER — HYDROCODONE-ACETAMINOPHEN 5-325 MG PO TABS
ORAL_TABLET | ORAL | 0 refills | Status: DC
  Filled 2022-06-28: qty 10, 3d supply, fill #0

## 2022-07-04 ENCOUNTER — Ambulatory Visit
Admission: RE | Admit: 2022-07-04 | Discharge: 2022-07-04 | Disposition: A | Discharge status: Home / Self Care | Payer: 59 | Source: Ambulatory Visit | Attending: Orthopedic Surgery | Admitting: Orthopedic Surgery

## 2022-07-04 DIAGNOSIS — S83232A Complex tear of medial meniscus, current injury, left knee, initial encounter: Secondary | ICD-10-CM | POA: Diagnosis not present

## 2022-07-04 DIAGNOSIS — S83289A Other tear of lateral meniscus, current injury, unspecified knee, initial encounter: Secondary | ICD-10-CM

## 2022-07-05 ENCOUNTER — Other Ambulatory Visit: Payer: 59

## 2022-07-05 DIAGNOSIS — S83242A Other tear of medial meniscus, current injury, left knee, initial encounter: Secondary | ICD-10-CM | POA: Diagnosis not present

## 2022-07-16 ENCOUNTER — Other Ambulatory Visit (HOSPITAL_COMMUNITY): Payer: Self-pay

## 2022-07-27 ENCOUNTER — Other Ambulatory Visit (HOSPITAL_COMMUNITY): Payer: Self-pay

## 2022-09-24 DIAGNOSIS — H52203 Unspecified astigmatism, bilateral: Secondary | ICD-10-CM | POA: Diagnosis not present

## 2022-09-24 DIAGNOSIS — H40033 Anatomical narrow angle, bilateral: Secondary | ICD-10-CM | POA: Diagnosis not present

## 2022-09-24 DIAGNOSIS — H5203 Hypermetropia, bilateral: Secondary | ICD-10-CM | POA: Diagnosis not present

## 2022-09-24 DIAGNOSIS — H524 Presbyopia: Secondary | ICD-10-CM | POA: Diagnosis not present

## 2022-10-07 ENCOUNTER — Other Ambulatory Visit (HOSPITAL_COMMUNITY): Payer: Self-pay

## 2022-11-02 ENCOUNTER — Other Ambulatory Visit (HOSPITAL_COMMUNITY): Payer: Self-pay

## 2022-11-12 ENCOUNTER — Other Ambulatory Visit (HOSPITAL_COMMUNITY): Payer: Self-pay

## 2022-11-18 ENCOUNTER — Other Ambulatory Visit (HOSPITAL_COMMUNITY): Payer: Self-pay

## 2022-11-18 MED ORDER — PREDNISOLONE ACETATE 1 % OP SUSP
1.0000 [drp] | Freq: Four times a day (QID) | OPHTHALMIC | 0 refills | Status: DC
  Filled 2022-11-18: qty 5, 25d supply, fill #0

## 2022-11-19 DIAGNOSIS — H40032 Anatomical narrow angle, left eye: Secondary | ICD-10-CM | POA: Diagnosis not present

## 2022-12-03 ENCOUNTER — Telehealth: Payer: Self-pay

## 2022-12-03 NOTE — Telephone Encounter (Signed)
-----   Message from Yetta Flock, MD sent at 12/02/2022  4:57 PM EST ----- Regarding: colonoscopy at [facility], This is one of the hospitalists at [facility] who asked me to help coordinate a colonoscopy with me for screening. Can you call him to set up a colonoscopy for him with me? His cell is [phone]. Thank you!  Dr. Loni Muse

## 2022-12-03 NOTE — Telephone Encounter (Signed)
Brooklyn I reached out to Dr. Algis Liming - he was hoping to do it sooner than 3/14. I went through a few options with him. I can add him on at 730 on Friday 3/1 and that would work better for him. Can you please help change his date and confirm with him? Thank you very much for your help with this.

## 2022-12-03 NOTE — Telephone Encounter (Addendum)
Called and spoke with patient to schedule screening colonoscopy. Pt has been scheduled for a virtual PV on Wednesday 12/25/22 at 9 am. Pt is scheduled for colonoscopy in the [facility] on Thursday, 01/09/23 at 2 pm. Pt is aware that he will need to arrive at 1 pm with a care partner. Pt does take Metformin and baby aspirin. I informed patient that he will not need to hold Aspirin and the nurse will review diabetic medications with him. Pt requested a call with a sooner appt. 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 Pt requested an appt anytime between 2/28 and 3/5. I advised patient that we will contact him if we have any cancellations. Pt also reports that he had his last colonoscopy at [facility] with Dr. Wallis Mart and requested that we obtain those records. I informed patient that we will get records and have Dr. Havery Moros review. Pt verbalized understanding and had no concerns at the end of the call.  Called [facility], left a detailed voicemail requesting that patient's last colonoscopy be faxed to our office for review prior to patient's upcoming colonoscopy. I asked that report be faxed to my attention at [phone]. I asked that they call the main office if they have any questions or concerns.

## 2022-12-04 NOTE — Telephone Encounter (Signed)
Colonoscopy appt has been rescheduled to 12/17/22 at 7:30 am, pt is to arrive at 7 am with a care partner. Pt's telephone PV was rescheduled to Wednesday, 12/11/22 at 9 am.

## 2022-12-04 NOTE — Telephone Encounter (Signed)
Brooklyn sorry to adjust again - I spoke with him again and he would prefer to do 2/20 at 730. Can you adjust and put him in that slot and review details with him? Let me know if any questions. Thank you.

## 2022-12-11 ENCOUNTER — Telehealth: Payer: Self-pay

## 2022-12-11 ENCOUNTER — Ambulatory Visit (AMBULATORY_SURGERY_CENTER): Payer: 59

## 2022-12-11 ENCOUNTER — Other Ambulatory Visit (HOSPITAL_COMMUNITY): Payer: Self-pay

## 2022-12-11 ENCOUNTER — Encounter: Payer: Self-pay | Admitting: Gastroenterology

## 2022-12-11 ENCOUNTER — Other Ambulatory Visit: Payer: Self-pay

## 2022-12-11 VITALS — Ht 72.0 in | Wt 205.0 lb

## 2022-12-11 DIAGNOSIS — Z1211 Encounter for screening for malignant neoplasm of colon: Secondary | ICD-10-CM

## 2022-12-11 MED ORDER — NA SULFATE-K SULFATE-MG SULF 17.5-3.13-1.6 GM/177ML PO SOLN
1.0000 | Freq: Once | ORAL | 0 refills | Status: AC
  Filled 2022-12-11: qty 354, 2d supply, fill #0

## 2022-12-11 NOTE — Telephone Encounter (Signed)
Records were received and placed in Dr. Doyne Keel office in box for review last week. Not sure if he has had a chance to review. Thanks

## 2022-12-11 NOTE — Telephone Encounter (Signed)
Yes thank you I did review it and asked for to be scanned into his file but I do not see it there yet.  I think he had a colonoscopy in 2018 and was told to repeat in 5 years, he is due.  Last exam was done with Dr. Cristina Gong.

## 2022-12-11 NOTE — Telephone Encounter (Signed)
Good Morning I just completed the PV for this patient. Pt was wanting to make sure we got his last colonoscopy report from [facility] for Dr. Havery Moros to review prior to his colonoscopy on 12/17/22. Brooklyn I see where you had requested these records but I am not seeing where anything was scanned in or sent down to PV. He would like [facility] to be reached out to again for these records in the event we do not have them. Thank you.

## 2022-12-11 NOTE — Progress Notes (Signed)
No egg or soy allergy known to patient  No issues known to pt with past sedation with any surgeries or procedures Patient denies ever being told they had issues or difficulty with intubation  No FH of Malignant Hyperthermia Pt is not on diet pills Pt is not on  home 02  Pt is not on blood thinners  Pt denies issues with constipation  No A fib or A flutter Have any cardiac testing pending--NO  Pt instructed to use Singlecare.com or GoodRx for a price reduction on prep   Patient's chart reviewed by Osvaldo Angst CNRA prior to previsit and patient appropriate for the [facility].  Previsit completed and red dot placed by patient's name on their procedure day (on provider's schedule).

## 2022-12-12 NOTE — Telephone Encounter (Signed)
Noted, thanks!

## 2022-12-17 ENCOUNTER — Ambulatory Visit: Payer: 59 | Admitting: Gastroenterology

## 2022-12-17 ENCOUNTER — Encounter: Payer: Self-pay | Admitting: Gastroenterology

## 2022-12-17 VITALS — BP 109/64 | HR 81 | Temp 97.1°F | Resp 15 | Ht 72.0 in | Wt 209.0 lb

## 2022-12-17 DIAGNOSIS — D120 Benign neoplasm of cecum: Secondary | ICD-10-CM

## 2022-12-17 DIAGNOSIS — Z1211 Encounter for screening for malignant neoplasm of colon: Secondary | ICD-10-CM

## 2022-12-17 DIAGNOSIS — D123 Benign neoplasm of transverse colon: Secondary | ICD-10-CM | POA: Diagnosis not present

## 2022-12-17 DIAGNOSIS — E119 Type 2 diabetes mellitus without complications: Secondary | ICD-10-CM | POA: Diagnosis not present

## 2022-12-17 DIAGNOSIS — E669 Obesity, unspecified: Secondary | ICD-10-CM | POA: Diagnosis not present

## 2022-12-17 DIAGNOSIS — K6389 Other specified diseases of intestine: Secondary | ICD-10-CM | POA: Diagnosis not present

## 2022-12-17 DIAGNOSIS — K635 Polyp of colon: Secondary | ICD-10-CM | POA: Diagnosis not present

## 2022-12-17 DIAGNOSIS — I2510 Atherosclerotic heart disease of native coronary artery without angina pectoris: Secondary | ICD-10-CM | POA: Diagnosis not present

## 2022-12-17 DIAGNOSIS — I10 Essential (primary) hypertension: Secondary | ICD-10-CM | POA: Diagnosis not present

## 2022-12-17 MED ORDER — SODIUM CHLORIDE 0.9 % IV SOLN: 500.0000 mL | Freq: Once | INTRAVENOUS | Status: DC

## 2022-12-17 NOTE — Progress Notes (Signed)
Pt's states no medical or surgical changes since previsit or office visit. 

## 2022-12-17 NOTE — Patient Instructions (Signed)
Discharge instructions given. Handouts on polyps and Hemorrhoids. Resume previous medications. YOU HAD AN ENDOSCOPIC PROCEDURE TODAY AT [facility]:   Refer to the procedure report that was given to you for any specific questions about what was found during the examination.  If the procedure report does not answer your questions, please call your gastroenterologist to clarify.  If you requested that your care partner not be given the details of your procedure findings, then the procedure report has been included in a sealed envelope for you to review at your convenience later.  YOU SHOULD EXPECT: Some feelings of bloating in the abdomen. Passage of more gas than usual.  Walking can help get rid of the air that was put into your GI tract during the procedure and reduce the bloating. If you had a lower endoscopy (such as a colonoscopy or flexible sigmoidoscopy) you may notice spotting of blood in your stool or on the toilet paper. If you underwent a bowel prep for your procedure, you may not have a normal bowel movement for a few days.  Please Note:  You might notice some irritation and congestion in your nose or some drainage.  This is from the oxygen used during your procedure.  There is no need for concern and it should clear up in a day or so.  SYMPTOMS TO REPORT IMMEDIATELY:  Following lower endoscopy (colonoscopy or flexible sigmoidoscopy):  Excessive amounts of blood in the stool  Significant tenderness or worsening of abdominal pains  Swelling of the abdomen that is new, acute  Fever of 100F or higher   For urgent or emergent issues, a gastroenterologist can be reached at any hour by calling [phone]. Do not use MyChart messaging for urgent concerns.    DIET:  We do recommend a small meal at first, but then you may proceed to your regular diet.  Drink plenty of fluids but you should avoid alcoholic beverages for 24 hours.  ACTIVITY:  You should plan to take it  easy for the rest of today and you should NOT DRIVE or use heavy machinery until tomorrow (because of the sedation medicines used during the test).    FOLLOW UP: Our staff will call the number listed on your records the next business day following your procedure.  We will call around 7:15- 8:00 am to check on you and address any questions or concerns that you may have regarding the information given to you following your procedure. If we do not reach you, we will leave a message.     If any biopsies were taken you will be contacted by phone or by letter within the next 1-3 weeks.  Please call us at [phone] if you have not heard about the biopsies in 3 weeks.    SIGNATURES/CONFIDENTIALITY: You and/or your care partner have signed paperwork which will be entered into your electronic medical record.  These signatures attest to the fact that that the information above on your After Visit Summary has been reviewed and is understood.  Full responsibility of the confidentiality of this discharge information lies with you and/or your care-partner.

## 2022-12-17 NOTE — Op Note (Signed)
[facility] Patient Name: Dustin [name] Procedure Date: 12/17/2022 7:27 AM MRN: [id number] Endoscopist: Remo Lipps P. Havery Moros , MD, [id number] Age: 57 Referring MD:  Date of Birth: 31-May-1966 Gender: Male Account #: [id number] Procedure:                Colonoscopy Indications:              Screening for colorectal malignant neoplasm Medicines:                Monitored Anesthesia Care Procedure:                Pre-Anesthesia Assessment:                           - Prior to the procedure, a History and Physical                            was performed, and patient medications and                            allergies were reviewed. The patient's tolerance of                            previous anesthesia was also reviewed. The risks                            and benefits of the procedure and the sedation                            options and risks were discussed with the patient.                            All questions were answered, and informed consent                            was obtained. Prior Anticoagulants: The patient has                            taken no anticoagulant or antiplatelet agents. ASA                            Grade Assessment: II - A patient with mild systemic                            disease. After reviewing the risks and benefits,                            the patient was deemed in satisfactory condition to                            undergo the procedure.                           After obtaining informed consent, the colonoscope  was passed under direct vision. Throughout the                            procedure, the patient's blood pressure, pulse, and                            oxygen saturations were monitored continuously. The                            Olympus CF-[id number] ([id number]) Colonoscope was                            introduced through the anus and advanced to the the                            cecum,  identified by appendiceal orifice and                            ileocecal valve. The colonoscopy was performed                            without difficulty. The patient tolerated the                            procedure well. The quality of the bowel                            preparation was good. The ileocecal valve,                            appendiceal orifice, and rectum were photographed. Scope In: 7:33:10 AM Scope Out: 7:57:01 AM Scope Withdrawal Time: 0 hours 21 minutes 47 seconds  Total Procedure Duration: 0 hours 23 minutes 51 seconds  Findings:                 The perianal and digital rectal examinations were                            normal.                           Two flat polyps were found in the cecum. The polyps                            were diminutive in size. These polyps were removed                            with a cold snare. Resection and retrieval were                            complete.                           A 3 mm polyp was found in the transverse colon. The  polyp was flat. The polyp was removed with a cold                            snare. Resection and retrieval were complete.                           One small subepithelial nodule was found in the                            sigmoid colon. Initially when probed with the snare                            it felt harder than typical lipoma so biopsies                            taken with a forceps, with revealed fatty tissue                            diagnostic for a benign lipoma.                           Internal hemorrhoids were found during                            retroflexion. They were small in size.                           The exam was otherwise without abnormality. Complications:            No immediate complications. Estimated blood loss:                            Minimal. Estimated Blood Loss:     Estimated blood loss was minimal. Impression:                - Two diminutive polyps in the cecum, removed with                            a cold snare. Resected and retrieved.                           - One 3 mm polyp in the transverse colon, removed                            with a cold snare. Resected and retrieved.                           - Subepithelial nodule in the sigmoid colon.                            Biopsied, consistent with benign lipoma.                           - Internal hemorrhoids.                           -  The examination was otherwise normal. Recommendation:           - Patient has a contact number available for                            emergencies. The signs and symptoms of potential                            delayed complications were discussed with the                            patient. Return to normal activities tomorrow.                            Written discharge instructions were provided to the                            patient.                           - Resume previous diet.                           - Continue present medications.                           - Await pathology results. Remo Lipps P. Havery Moros, MD 12/17/2022 8:03:13 AM This report has been signed electronically.

## 2022-12-17 NOTE — Progress Notes (Signed)
Report to PACU, RN, vss, BBS= Clear.  

## 2022-12-17 NOTE — Progress Notes (Signed)
[facility] History and Physical   Primary Care Physician:  Merrilee Seashore, MD   Reason for Procedure:   Colon cancer screening  Plan:    colonoscopy     HPI: Dustin [name] is a 57 y.o. male  here for colonoscopy screening - last exam > 10 years ago or so, Dr. Carma Leaven.    Patient denies any bowel symptoms at this time. No family history of colon cancer known. Otherwise feels well without any cardiopulmonary symptoms.   I have discussed risks / benefits of anesthesia and endoscopic procedure with Modena Jansky and they wish to proceed with the exams as outlined today.    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 Authorizing Provider  aspirin (ASPIRIN LOW DOSE) 81 MG chewable tablet CHEW AND SWALLOW 1 TABLET BY MOUTH DAILY 02/19/22 02/19/23  Adrian Prows, MD  cholecalciferol (VITAMIN D) 1000 UNITS tablet Take 5,000 Units by mouth every other day.    [provider]  clindamycin (CLEOCIN T) 1 % external solution Apply a small amount topically twice a day as directed to the scalp Patient not taking: Reported on 12/11/2022 06/26/22     COVID-19 mRNA bivalent vaccine, Pfizer, (PFIZER COVID-19 VAC BIVALENT) injection Inject into the muscle. 09/05/21   Carlyle Basques, MD  cyanocobalamin 1000 MCG tablet Take 5,000 mcg by mouth every other day.    [provider]  cyclobenzaprine (FLEXERIL) 10 MG tablet Take 1 tablet (10 mg total) by mouth 3 (three) times daily. Patient taking differently: Take 10 mg by  mouth as needed. 11/16/21     escitalopram (LEXAPRO) 10 MG tablet TAKE 1/2 TABLET BY MOUTH DAILY FOR THE FIRST 8 DAYS, THEN TAKE 1 TABLET DAILY Patient taking differently: Take 5 mg by mouth daily. 03/20/22     glucose blood (ACCU-CHEK GUIDE) test strip Use as instructed to check blood sugar once a day 02/08/19   Philemon Kingdom, MD  HYDROcodone-acetaminophen (NORCO/VICODIN) 5-325 MG tablet Take 1-2 tablets by mouth every 6 hours as needed Patient not taking: Reported on 12/11/2022 11/16/21     HYDROcodone-acetaminophen (NORCO/VICODIN) 5-325 MG tablet Take 1 tablet by mouth three times a day as needed for pain Patient not taking: Reported on 12/11/2022 06/28/22     meloxicam (MOBIC) 15 MG tablet Take 1 tablet by mouth once a day. Patient taking differently: Take 15 mg by mouth as needed. 06/13/22     metFORMIN (GLUCOPHAGE-XR) 500 MG 24 hr tablet TAKE 1 TABLET BY MOUTH ONCE DAILY AT [facility] Patient taking differently: Take 1,000 mg by mouth at bedtime. TAKE 1 TABLET BY MOUTH ONCE DAILY AT [facility] 01/12/19   Philemon Kingdom, MD  metFORMIN (GLUCOPHAGE-XR) 500 MG 24 hr tablet TAKE 1 TABLET BY MOUTH WITH EVENING MEAL Patient not taking: Reported on 12/11/2022 01/29/21     metFORMIN (GLUCOPHAGE-XR) 500 MG 24 hr tablet Take 2 tablets by mouth once a day in the evening Patient not taking: Reported on 12/11/2022 12/17/21  metoprolol tartrate (LOPRESSOR) 25 MG tablet TAKE 1 TABLET BY MOUTH TWICE DAILY 02/19/22 02/19/23  Cantwell, [place] C, PA-C  MICROLET LANCETS MISC Use 1x a day 05/15/16   Philemon Kingdom, MD  nitroGLYCERIN (NITROSTAT) 0.4 MG SL tablet DISSOLVE 1 TABLET BY MOUTH EVERY 5 MINUTES AS NEEDED FOR CHEST PAIN 02/22/21   Adrian Prows, MD  pantoprazole (PROTONIX) 40 MG tablet Take 1 tablet (40 mg total) by mouth daily. Please schedule appointment for refills Patient taking differently: Take 40 mg by mouth as needed. Please schedule appointment for refills 10/07/19   Adrian Prows, MD  pantoprazole (PROTONIX) 40  MG tablet Take 1 tablet by mouth once daily Patient not taking: Reported on 12/11/2022 11/15/21     prednisoLONE acetate (PRED FORTE) 1 % ophthalmic suspension Place 1 drop into the right eye 4 (four) times daily for 4 days, start after treatment on the day of surgery Patient not taking: Reported on 12/11/2022 09/24/22     rosuvastatin (CRESTOR) 20 MG tablet Take 1 tablet (20 mg total) by mouth daily. 02/19/22 02/19/23  Adrian Prows, MD    Current Outpatient Medications  Medication Sig Dispense Refill   aspirin (ASPIRIN LOW DOSE) 81 MG chewable tablet CHEW AND SWALLOW 1 TABLET BY MOUTH DAILY 90 tablet 3   cholecalciferol (VITAMIN D) 1000 UNITS tablet Take 5,000 Units by mouth every other day.     clindamycin (CLEOCIN T) 1 % external solution Apply a small amount topically twice a day as directed to the scalp (Patient not taking: Reported on 12/11/2022) 60 mL 11   COVID-19 mRNA bivalent vaccine, Pfizer, (PFIZER COVID-19 VAC BIVALENT) injection Inject into the muscle. 0.3 mL 0   cyanocobalamin 1000 MCG tablet Take 5,000 mcg by mouth every other day.     cyclobenzaprine (FLEXERIL) 10 MG tablet Take 1 tablet (10 mg total) by mouth 3 (three) times daily. (Patient taking differently: Take 10 mg by mouth as needed.) 60 tablet 3   escitalopram (LEXAPRO) 10 MG tablet TAKE 1/2 TABLET BY MOUTH DAILY FOR THE FIRST 8 DAYS, THEN TAKE 1 TABLET DAILY (Patient taking differently: Take 5 mg by mouth daily.) 30 tablet 9   glucose blood (ACCU-CHEK GUIDE) test strip Use as instructed to check blood sugar once a day 50 each 12   HYDROcodone-acetaminophen (NORCO/VICODIN) 5-325 MG tablet Take 1-2 tablets by mouth every 6 hours as needed (Patient not taking: Reported on 12/11/2022) 30 tablet 0   HYDROcodone-acetaminophen (NORCO/VICODIN) 5-325 MG tablet Take 1 tablet by mouth three times a day as needed for pain (Patient not taking: Reported on 12/11/2022) 10 tablet 0   meloxicam (MOBIC) 15 MG tablet Take 1 tablet by mouth once a  day. (Patient taking differently: Take 15 mg by mouth as needed.) 30 tablet 9   metFORMIN (GLUCOPHAGE-XR) 500 MG 24 hr tablet TAKE 1 TABLET BY MOUTH ONCE DAILY AT DINNER (Patient taking differently: Take 1,000 mg by mouth at bedtime. TAKE 1 TABLET BY MOUTH ONCE DAILY AT DINNER) 90 tablet 3   metFORMIN (GLUCOPHAGE-XR) 500 MG 24 hr tablet TAKE 1 TABLET BY MOUTH WITH EVENING MEAL (Patient not taking: Reported on 12/11/2022) 90 tablet 4   metFORMIN (GLUCOPHAGE-XR) 500 MG 24 hr tablet Take 2 tablets by mouth once a day in the evening (Patient not taking: Reported on 12/11/2022) 180 tablet 3   metoprolol tartrate (LOPRESSOR) 25 MG tablet TAKE 1 TABLET BY MOUTH TWICE DAILY 180 tablet 3   MICROLET LANCETS MISC Use 1x a day 100 each 3  nitroGLYCERIN (NITROSTAT) 0.4 MG SL tablet DISSOLVE 1 TABLET BY MOUTH EVERY 5 MINUTES AS NEEDED FOR CHEST PAIN 25 tablet 3   pantoprazole (PROTONIX) 40 MG tablet Take 1 tablet (40 mg total) by mouth daily. Please schedule appointment for refills (Patient taking differently: Take 40 mg by mouth as needed. Please schedule appointment for refills) 30 tablet 0   pantoprazole (PROTONIX) 40 MG tablet Take 1 tablet by mouth once daily (Patient not taking: Reported on 12/11/2022) 30 tablet 6   prednisoLONE acetate (PRED FORTE) 1 % ophthalmic suspension Place 1 drop into the right eye 4 (four) times daily for 4 days, start after treatment on the day of surgery (Patient not taking: Reported on 12/11/2022) 5 mL 0   rosuvastatin (CRESTOR) 20 MG tablet Take 1 tablet (20 mg total) by mouth daily. 90 tablet 3   Current Facility-Administered Medications  Medication Dose Route Frequency Provider Last Rate Last Admin   0.9 %  sodium chloride infusion  500 mL Intravenous Once Lashan Gluth, Carlota Raspberry, MD        Allergies as of 12/17/2022   (No Known Allergies)    Family History  Problem Relation Age of Onset   Other Mother        other   Diabetes type II Father    Coronary artery disease  Father    Healthy Brother    Healthy Daughter    Healthy Son    Diabetes Other        Paternal aunt   CAD Other        Paternal uncle   Colon cancer Neg Hx    Colon polyps Neg Hx    Esophageal cancer Neg Hx    Rectal cancer Neg Hx    Stomach cancer Neg Hx     Social History   Socioeconomic History   Marital status: Married    Spouse name: Not on file   Number of children: 2   Years of education: Not on file   Highest education level: Not on file  Occupational History   Not on file  Tobacco Use   Smoking status: Never   Smokeless tobacco: Never  Vaping Use   Vaping Use: Never used  Substance and Sexual Activity   Alcohol use: Yes    Alcohol/week: 0.0 standard drinks of alcohol    Comment: occasionally, 1-2 scotch 3-4 times monthly   Drug use: No   Sexual activity: Not on file  Other Topics Concern   Not on file  Social History Narrative   Lives with wife and 2 children in a 2 story home.     Works as a Engineer, drilling for [employer].   Social Determinants of Health   Financial Resource Strain: Not on file  Food Insecurity: Not on file  Transportation Needs: Not on file  Physical Activity: Not on file  Stress: Not on file  Social Connections: Not on file  Intimate Partner Violence: Not on file    Review of Systems: All other review of systems negative except as mentioned in the HPI.  Physical Exam: Vital signs BP (!) 92/54   Pulse 75   Temp (!) 97.1 F (36.2 C)   Ht 6' (1.829 m)   Wt 209 lb (94.8 kg)   SpO2 98%   BMI 28.35 kg/m   General:   Alert,  Well-developed, pleasant and cooperative in NAD Lungs:  Clear throughout to auscultation.   Heart:  Regular rate and rhythm Abdomen:  Soft, nontender and  nondistended.   Neuro/Psych:  Alert and cooperative. Normal mood and affect. A and O x 3  Jolly Mango, MD [facility]

## 2022-12-17 NOTE — Progress Notes (Signed)
Called to room to assist during endoscopic procedure.  Patient ID and intended procedure confirmed with present staff. Received instructions for my participation in the procedure from the performing physician.  

## 2022-12-18 ENCOUNTER — Telehealth: Payer: Self-pay | Admitting: *Deleted

## 2022-12-18 NOTE — Telephone Encounter (Signed)
  Follow up Call-     12/17/2022    7:16 AM  Call back number  Post procedure Call Back phone  # [phone]  Permission to leave phone message Yes     Patient questions:  Do you have a fever, pain , or abdominal swelling? No Pain Score  0 *  Have you tolerated food without any problems? Yes.    Have you been able to return to your normal activities? Yes.    Do you have any questions about your discharge instructions: Diet   No. Medications  No. Follow up visit  No.  Do you have questions or concerns about your Care? No.  Actions: * If pain score is 4 or above: No action needed, pain <4.

## 2022-12-31 DIAGNOSIS — H40031 Anatomical narrow angle, right eye: Secondary | ICD-10-CM | POA: Diagnosis not present

## 2023-01-09 ENCOUNTER — Encounter: Payer: Commercial Managed Care - PPO | Admitting: Gastroenterology

## 2023-01-09 ENCOUNTER — Other Ambulatory Visit (HOSPITAL_COMMUNITY): Payer: Self-pay

## 2023-01-09 ENCOUNTER — Other Ambulatory Visit: Payer: Self-pay

## 2023-01-09 ENCOUNTER — Other Ambulatory Visit: Payer: Self-pay | Admitting: Cardiology

## 2023-01-09 DIAGNOSIS — I10 Essential (primary) hypertension: Secondary | ICD-10-CM

## 2023-01-09 DIAGNOSIS — I25118 Atherosclerotic heart disease of native coronary artery with other forms of angina pectoris: Secondary | ICD-10-CM

## 2023-01-09 MED ORDER — METOPROLOL TARTRATE 25 MG PO TABS
25.0000 mg | ORAL_TABLET | Freq: Two times a day (BID) | ORAL | 3 refills | Status: DC
  Filled 2023-01-09: qty 180, fill #0
  Filled 2023-01-13 – 2023-01-15 (×2): qty 180, 90d supply, fill #0
  Filled 2023-03-31 – 2023-04-07 (×2): qty 180, 90d supply, fill #1
  Filled 2023-06-23 – 2023-07-03 (×2): qty 180, 90d supply, fill #2

## 2023-01-10 ENCOUNTER — Other Ambulatory Visit: Payer: Self-pay

## 2023-01-10 ENCOUNTER — Other Ambulatory Visit (HOSPITAL_COMMUNITY): Payer: Self-pay

## 2023-01-10 MED ORDER — METFORMIN HCL ER 500 MG PO TB24
1000.0000 mg | ORAL_TABLET | Freq: Every evening | ORAL | 3 refills | Status: DC
  Filled 2023-01-10: qty 180, 90d supply, fill #0
  Filled 2023-03-31: qty 180, 90d supply, fill #1
  Filled 2023-06-23: qty 180, 90d supply, fill #2
  Filled 2023-10-04: qty 180, 90d supply, fill #3

## 2023-01-13 ENCOUNTER — Encounter: Payer: Self-pay | Admitting: Cardiology

## 2023-01-13 ENCOUNTER — Other Ambulatory Visit (HOSPITAL_COMMUNITY): Payer: Self-pay

## 2023-01-14 NOTE — Telephone Encounter (Signed)
Labs 01/08/2023:  Serum glucose 133 mg, BUN 14, creatinine 1.10, EGFR 80 mL, potassium 4.5, LFTs normal.  Total cholesterol 10/29/2011, triglycerides 90, HDL 49, LDL 47.  TSH normal at 4.4 2 0.  PSA normal at 0.5.  Hb 15.2/HCT 46.0, platelets 240.  A1c 6.3%.

## 2023-01-15 ENCOUNTER — Other Ambulatory Visit: Payer: Self-pay

## 2023-02-19 ENCOUNTER — Other Ambulatory Visit: Payer: Self-pay | Admitting: Cardiology

## 2023-02-19 ENCOUNTER — Other Ambulatory Visit (HOSPITAL_COMMUNITY): Payer: Self-pay

## 2023-02-19 MED ORDER — NITROGLYCERIN 0.4 MG SL SUBL
SUBLINGUAL_TABLET | SUBLINGUAL | 0 refills | Status: AC
  Filled 2023-02-19: qty 25, 8d supply, fill #0

## 2023-03-05 ENCOUNTER — Other Ambulatory Visit (HOSPITAL_COMMUNITY): Payer: Self-pay

## 2023-03-31 ENCOUNTER — Other Ambulatory Visit: Payer: Self-pay | Admitting: Cardiology

## 2023-03-31 ENCOUNTER — Other Ambulatory Visit (HOSPITAL_COMMUNITY): Payer: Self-pay

## 2023-03-31 ENCOUNTER — Other Ambulatory Visit: Payer: Self-pay

## 2023-03-31 DIAGNOSIS — E7800 Pure hypercholesterolemia, unspecified: Secondary | ICD-10-CM

## 2023-03-31 DIAGNOSIS — I2510 Atherosclerotic heart disease of native coronary artery without angina pectoris: Secondary | ICD-10-CM

## 2023-03-31 MED ORDER — ESCITALOPRAM OXALATE 10 MG PO TABS
10.0000 mg | ORAL_TABLET | Freq: Every day | ORAL | 3 refills | Status: DC
  Filled 2023-03-31: qty 90, 90d supply, fill #0
  Filled 2023-06-23: qty 90, 90d supply, fill #1

## 2023-04-01 ENCOUNTER — Other Ambulatory Visit (HOSPITAL_COMMUNITY): Payer: Self-pay

## 2023-04-01 ENCOUNTER — Other Ambulatory Visit: Payer: Self-pay

## 2023-04-01 MED ORDER — ASPIRIN 81 MG PO CHEW
CHEWABLE_TABLET | ORAL | 0 refills | Status: DC
  Filled 2023-04-01: qty 90, 90d supply, fill #0

## 2023-04-01 MED ORDER — ROSUVASTATIN CALCIUM 20 MG PO TABS
20.0000 mg | ORAL_TABLET | Freq: Every day | ORAL | 0 refills | Status: DC
  Filled 2023-04-01: qty 90, 90d supply, fill #0

## 2023-04-02 ENCOUNTER — Other Ambulatory Visit (HOSPITAL_COMMUNITY): Payer: Self-pay

## 2023-04-08 ENCOUNTER — Other Ambulatory Visit (HOSPITAL_COMMUNITY): Payer: Self-pay

## 2023-04-29 ENCOUNTER — Other Ambulatory Visit (HOSPITAL_COMMUNITY): Payer: Self-pay

## 2023-04-29 MED ORDER — PREDNISONE 10 MG PO TABS
ORAL_TABLET | ORAL | 0 refills | Status: AC
  Filled 2023-04-29: qty 30, 12d supply, fill #0

## 2023-06-03 ENCOUNTER — Encounter: Payer: Self-pay | Admitting: Cardiology

## 2023-06-04 NOTE — Telephone Encounter (Signed)
From patient.

## 2023-06-07 NOTE — Telephone Encounter (Signed)
Labs 05/24/2023:  A1c 6.2%.  Total cholesterol 111, triglycerides 186, HDL 38, LDL 73.  Normal  BUN 17, creatinine 0.8, EGFR 1 1 1  mL, potassium 4.6.  Hb 15.7/HCT 48.9, platelets 187.  TSH normal at 2.249, total T3 and T4 normal.  Vitamin D46.6.

## 2023-06-09 NOTE — Telephone Encounter (Signed)
From patient.

## 2023-06-12 ENCOUNTER — Other Ambulatory Visit (HOSPITAL_COMMUNITY): Payer: Self-pay

## 2023-06-12 ENCOUNTER — Other Ambulatory Visit: Payer: Self-pay | Admitting: Cardiology

## 2023-06-12 DIAGNOSIS — E7800 Pure hypercholesterolemia, unspecified: Secondary | ICD-10-CM

## 2023-06-12 DIAGNOSIS — I2510 Atherosclerotic heart disease of native coronary artery without angina pectoris: Secondary | ICD-10-CM

## 2023-06-25 ENCOUNTER — Other Ambulatory Visit (HOSPITAL_COMMUNITY): Payer: Self-pay

## 2023-06-25 ENCOUNTER — Other Ambulatory Visit: Payer: Self-pay

## 2023-06-25 ENCOUNTER — Other Ambulatory Visit: Payer: Self-pay | Admitting: Cardiology

## 2023-06-25 DIAGNOSIS — E7800 Pure hypercholesterolemia, unspecified: Secondary | ICD-10-CM

## 2023-06-25 DIAGNOSIS — I2510 Atherosclerotic heart disease of native coronary artery without angina pectoris: Secondary | ICD-10-CM

## 2023-06-25 MED ORDER — ROSUVASTATIN CALCIUM 20 MG PO TABS
20.0000 mg | ORAL_TABLET | Freq: Every day | ORAL | 3 refills | Status: DC
Start: 2023-06-25 — End: 2023-11-13
  Filled 2023-06-25: qty 90, 90d supply, fill #0
  Filled 2023-10-04: qty 90, 90d supply, fill #1

## 2023-06-25 MED ORDER — ASPIRIN 81 MG PO CHEW
81.0000 mg | CHEWABLE_TABLET | Freq: Every day | ORAL | 3 refills | Status: DC
Start: 2023-06-25 — End: 2024-06-23
  Filled 2023-06-25: qty 90, 90d supply, fill #0
  Filled 2023-10-04: qty 90, 90d supply, fill #1
  Filled 2024-01-17: qty 90, 90d supply, fill #2
  Filled 2024-04-14: qty 90, 90d supply, fill #3

## 2023-06-25 NOTE — Progress Notes (Signed)
ICD-10-CM   1. Coronary artery disease involving native coronary artery of native heart without angina pectoris  I25.10 aspirin (ASPIRIN LOW DOSE) 81 MG chewable tablet    2. Hypercholesteremia  E78.00 rosuvastatin (CRESTOR) 20 MG tablet     Meds ordered this encounter  Medications   aspirin (ASPIRIN LOW DOSE) 81 MG chewable tablet    Sig: Chew 1 tablet (81 mg total) by mouth daily.    Dispense:  90 tablet    Refill:  3    Patient needs appointment for further refills   rosuvastatin (CRESTOR) 20 MG tablet    Sig: Take 1 tablet (20 mg total) by mouth daily. Take 1/2 tab (10 mg) on Tue, Thur    Dispense:  90 tablet    Refill:  3    Medications Discontinued During This Encounter  Medication Reason   aspirin (ASPIRIN LOW DOSE) 81 MG chewable tablet Reorder   rosuvastatin (CRESTOR) 20 MG tablet Reorder

## 2023-06-26 ENCOUNTER — Other Ambulatory Visit (HOSPITAL_COMMUNITY): Payer: Self-pay

## 2023-06-26 DIAGNOSIS — R7309 Other abnormal glucose: Secondary | ICD-10-CM | POA: Diagnosis not present

## 2023-06-26 DIAGNOSIS — E782 Mixed hyperlipidemia: Secondary | ICD-10-CM | POA: Diagnosis not present

## 2023-06-26 DIAGNOSIS — I2510 Atherosclerotic heart disease of native coronary artery without angina pectoris: Secondary | ICD-10-CM | POA: Diagnosis not present

## 2023-06-26 DIAGNOSIS — Z0000 Encounter for general adult medical examination without abnormal findings: Secondary | ICD-10-CM | POA: Diagnosis not present

## 2023-10-04 ENCOUNTER — Other Ambulatory Visit (HOSPITAL_COMMUNITY): Payer: Self-pay

## 2023-10-08 ENCOUNTER — Telehealth: Payer: Self-pay | Admitting: *Deleted

## 2023-10-08 NOTE — Telephone Encounter (Signed)
Attempted to contact pt at Dr Verl Dicker request.  Left message to call back to schedule.  Per Dr Jacinto Halim - please double where ever pt can come.

## 2023-10-08 NOTE — Telephone Encounter (Signed)
Pt has been scheduled with Dr Jacinto Halim 11/13/2023

## 2023-11-13 ENCOUNTER — Ambulatory Visit: Payer: 59 | Attending: Cardiology | Admitting: Cardiology

## 2023-11-13 ENCOUNTER — Encounter: Payer: Self-pay | Admitting: Cardiology

## 2023-11-13 ENCOUNTER — Other Ambulatory Visit: Payer: Self-pay

## 2023-11-13 ENCOUNTER — Other Ambulatory Visit (HOSPITAL_COMMUNITY): Payer: Self-pay

## 2023-11-13 VITALS — BP 96/70 | HR 67 | Resp 16 | Ht 72.0 in | Wt 211.6 lb

## 2023-11-13 DIAGNOSIS — I2510 Atherosclerotic heart disease of native coronary artery without angina pectoris: Secondary | ICD-10-CM

## 2023-11-13 DIAGNOSIS — M2559 Pain in other specified joint: Secondary | ICD-10-CM | POA: Diagnosis not present

## 2023-11-13 DIAGNOSIS — K219 Gastro-esophageal reflux disease without esophagitis: Secondary | ICD-10-CM

## 2023-11-13 DIAGNOSIS — I10 Essential (primary) hypertension: Secondary | ICD-10-CM

## 2023-11-13 DIAGNOSIS — E7800 Pure hypercholesterolemia, unspecified: Secondary | ICD-10-CM | POA: Diagnosis not present

## 2023-11-13 MED ORDER — PANTOPRAZOLE SODIUM 40 MG PO TBEC
40.0000 mg | DELAYED_RELEASE_TABLET | Freq: Every day | ORAL | 3 refills | Status: AC
  Filled 2023-11-13: qty 30, 30d supply, fill #0
  Filled 2024-04-14: qty 30, 30d supply, fill #1
  Filled 2024-10-04: qty 30, 30d supply, fill #2

## 2023-11-13 MED ORDER — ROSUVASTATIN CALCIUM 10 MG PO TABS
10.0000 mg | ORAL_TABLET | Freq: Every day | ORAL | 3 refills | Status: DC
  Filled 2023-11-13: qty 50, 50d supply, fill #0
  Filled 2024-04-14: qty 50, 50d supply, fill #1
  Filled 2024-06-23 (×2): qty 50, 50d supply, fill #2
  Filled 2024-08-28: qty 50, 50d supply, fill #3

## 2023-11-13 MED ORDER — ROSUVASTATIN CALCIUM 20 MG PO TABS
20.0000 mg | ORAL_TABLET | ORAL | 3 refills | Status: AC
Start: 2023-11-13 — End: 2024-11-12
  Filled 2023-11-13: qty 30, fill #0

## 2023-11-13 MED ORDER — MELOXICAM 15 MG PO TABS
15.0000 mg | ORAL_TABLET | ORAL | 2 refills | Status: AC | PRN
  Filled 2023-11-13: qty 30, 30d supply, fill #0
  Filled 2024-08-28 – 2024-10-04 (×2): qty 30, 30d supply, fill #1

## 2023-11-13 NOTE — Progress Notes (Signed)
Cardiology Office Note:  .   Date:  11/13/2023  ID:  Dustin [name], DOB Jul 23, 1966, MRN [id number] PCP: Georgianne Fick, MD  [facility] Providers Cardiologist:  Yates Decamp, MD   History of Present Illness: .   Dustin [name] is a 58 y.o. Asian [place] Male with family history of coronary artery disease in his father at age 72Y,  h/o prediabetes, nonspecific peripheral neuropathy, hyperlipidemia, CAD with CTA on 12/15/2017, revealing high-grade stenosis in the mid LAD and diffuse disease involving all 3 major vessels.  He has recommended aggressive medical therapy under started on statin along with aspirin and beta blocker.    Since being on aggressive medical therapy, he has not had any recurrence of angina pectoris.  He is tolerating all his medications well.   Discussed the use of AI scribe software for clinical note transcription with the patient, who gave verbal consent to proceed.  History of Present Illness   The patient, with a history of diabetes and coronary artery disease, presents with occasional orthostatic dizziness and chest discomfort. He reports feeling lethargic on metoprolol, which he has gradually discontinued over the past three months. He also reports occasional chest discomfort, particularly during the first few minutes of jogging, which usually resolves after 5-6 minutes. He also experiences this discomfort when climbing stairs. The patient has been managing his diabetes with metformin and his cholesterol with rosuvastatin and aspirin. He reports some confusion about the dosing of rosuvastatin, but has been taking 20mg  daily except on Tuesdays and Thursdays when he takes 10mg . He also reports occasional joint pain, for which he takes Mobic as needed, and occasional acid reflux, for which he takes Protonix as needed.       Labs   Lab Results  Component Value Date   CHOL 145 06/07/2019   HDL 47 06/07/2019   LDLCALC 74 06/07/2019   LDLDIRECT 79  06/07/2019   TRIG 118 06/07/2019   CHOLHDL 4 05/30/2016   Lab Results  Component Value Date   NA 142 02/21/2020   K 4.4 02/21/2020   CO2 24 02/21/2020   GLUCOSE 118 (H) 02/21/2020   BUN 13 02/21/2020   CREATININE 0.99 02/21/2020   CALCIUM 10.0 02/21/2020   GFR 75.98 12/29/2015   GFRNONAA 87 02/21/2020      Latest Ref Rng & Units 02/21/2020    8:18 AM 06/15/2019    4:36 PM 12/29/2015   11:58 AM  BMP  Glucose 65 - 99 mg/dL 409  85  98   BUN 6 - 24 mg/dL 13  13  20    Creatinine 0.76 - 1.27 mg/dL 8.11  9.14  7.82   BUN/Creat Ratio 9 - 20 13  13     Sodium 134 - 144 mmol/L 142  142  140   Potassium 3.5 - 5.2 mmol/L 4.4  4.1  4.1   Chloride 96 - 106 mmol/L 102  100  105   CO2 20 - 29 mmol/L 24  23  30    Calcium 8.7 - 10.2 mg/dL 95.6  9.8  9.4       Latest Ref Rng & Units 06/07/2019    9:18 AM 01/15/2016    3:17 PM 12/29/2015   11:58 AM  CBC  WBC 3.4 - 10.8 x10E3/uL 7.0  6.6  5.9   Hemoglobin 13.0 - 17.7 g/dL 21.3  08.6  57.8   Hematocrit 37.5 - 51.0 % 50.5  50.6  53.1   Platelets 150 - 450 x10E3/uL 220  273  230.0    External Labs:  Labs 05/24/2023:   A1c 6.2%.   Total cholesterol 111, triglycerides 186, HDL 38, LDL 36,  NHDL 73   BUN 17, creatinine 0.8, EGFR 1 1 1  mL, potassium 4.6.   Hb 15.7/HCT 48.9, platelets 187.   TSH normal at 2.249, total T3 and T4 normal.  Vitamin D46.6.   On 02/01/2022 labs, your total cholesterol is 112, triglycerides 105, HDL 40, LDL 51.  Review of Systems  Cardiovascular:  Positive for chest pain. Negative for dyspnea on exertion and leg swelling.    Physical Exam:   VS:  BP 96/70 (BP Location: Left Arm, Patient Position: Sitting, Cuff Size: Normal)   Pulse 67   Resp 16   Ht 6' (1.829 m)   Wt 211 lb 9.6 oz (96 kg)   SpO2 96%   BMI 28.70 kg/m     Wt Readings from Last 3 Encounters:  11/13/23 211 lb 9.6 oz (96 kg)  12/17/22 209 lb (94.8 kg)  12/11/22 205 lb (93 kg)     Physical Exam Neck:     Vascular: No carotid bruit or JVD.   Cardiovascular:     Rate and Rhythm: Normal rate and regular rhythm.     Pulses: Intact distal pulses.     Heart sounds: Normal heart sounds. No murmur heard.    No gallop.  Pulmonary:     Effort: Pulmonary effort is normal.     Breath sounds: Normal breath sounds.  Abdominal:     General: Bowel sounds are normal.     Palpations: Abdomen is soft.  Musculoskeletal:     Right lower leg: No edema.     Left lower leg: No edema.     Studies Reviewed: Marland Kitchen    Coronary CTA 12/15/2017:  Coronary calcium score 25. Right dominant. Long Mid LAD 70% stenosis. FFR significant. LAD: Proximal CT FFR: 0.89, mid: 0.83, distal 0.76.  hemodynamically significant stenosis in the mid LAD Distal RCA 25-50% diffuse disease, circumflex made 25% stenosis.   Coronary CT morphology 03/08/2020: 1. Coronary calcium score of 41.7. This was 88 percentile for age and sex matched control. Previously calcium score of 25 (78 percentile for age and sex matched control). 2. Normal coronary origin with right dominance. 3. CAD-RADS 3. Moderate stenosis in the mid LAD, this is a long diffuse lesion, similar to findings in 2019. Consider intensifying lipid management. Additional analysis with CT FFR will be submitted.   1. Left Main: 0.95. 2. LAD: Proximal; 0.86, mid: 0.89, distal: 0.84. 3. LCX: 0.90. 4. OM1: Proximal; 0.87, distal; 0.83. 5. OM3: 0.87. 6. RCA: 0.97.   IMPRESSION: 1. CT FFR analysis didn't show any significant stenosis. Aggressive medical management is recommended.   Cardiac Studies:    Sleep Study   07/10/2016  Mild obstructive sleep apnea occurred during this study (AHI = 8.7/h). - No significant central sleep apnea occurred during this study (CAI = 0.0/h). - Moderate oxygen desaturation was noted during this study (Min O2 = 86%). - Patient snored 2.0% during the sleep.   Treadmill exercise stress test 12/11/2017: Indication: Chest pain. The patient exercised on Bruce protocol for  10:00  min. Patient achieved  11.78 METS and reached HR  194 bpm, which is  113% of maximum age-predicted HR.  Stress test terminated due to fatigue. Chest Pain: non-limiting. BP Response to Exercise: Normal resting BP- appropriate response. Arrhythmias: none. HR Response to Exercise: Appropriate. ST Changes: With peak exercise there was  down-sloping ST depression of 2 mm in the inferior leads and 2 mm upsloping ST depression in lateral leads which persisted for >2 minutes into recovery. This was positive for ischemia and associated with chest tightness which improved in rest. Overall Impression: Positive stress test typical of ischemia.  Consider furtner cardiac work-up.   Echocardiogram   12/23/2017: Left ventricle cavity is normal in size. Normal global wall motion. Normal diastolic filling pattern. Calculated EF 67%. No significant valvular abnormality.  EKG:    EKG Interpretation Date/Time:  Thursday November 13 2023 10:58:02 EST Ventricular Rate:  89 PR Interval:  154 QRS Duration:  70 QT Interval:  346 QTC Calculation: 420 R Axis:   74  Text Interpretation: EKG 11/13/2023: Normal sinus rhythm at rate of 89 bpm, normal axis.  Single PVC. Confirmed by Delrae Rend [phone]) on 11/13/2023 11:13:03 AM    EKG 02/21/2021: Normal sinus rhythm at rate of 72 bpm, normal axis. Low-voltage complexes otherwise normal EKG.   Medications and allergies    No Known Allergies   Current Outpatient Medications:    aspirin (ASPIRIN LOW DOSE) 81 MG chewable tablet, Chew 1 tablet (81 mg total) by mouth daily., Disp: 90 tablet, Rfl: 3   cholecalciferol (VITAMIN D) 1000 UNITS tablet, Take 5,000 Units by mouth every other day., Disp: , Rfl:    cyanocobalamin 1000 MCG tablet, Take 5,000 mcg by mouth every other day., Disp: , Rfl:    metFORMIN (GLUCOPHAGE-XR) 500 MG 24 hr tablet, Take 2 tablets (1,000 mg total) by mouth every evening., Disp: 180 tablet, Rfl: 3   nitroGLYCERIN (NITROSTAT) 0.4 MG SL tablet,  Dissolve 1 tablet by mouth every 5 minutes as needed for chest pain. If pains continue after 3 doses, call 911., Disp: 25 tablet, Rfl: 0   rosuvastatin (CRESTOR) 10 MG tablet, Take 1 tablet (10 mg total) by mouth daily. 5 days a week, Disp: 50 tablet, Rfl: 3   meloxicam (MOBIC) 15 MG tablet, Take 1 tablet (15 mg total) by mouth as needed for pain., Disp: 30 tablet, Rfl: 2   pantoprazole (PROTONIX) 40 MG tablet, Take 1 tablet by mouth once daily, Disp: 30 tablet, Rfl: 3   rosuvastatin (CRESTOR) 20 MG tablet, Take 1 tablet (20 mg total) by mouth as directed. 1 tab Tue and Thur, Disp: 30 tablet, Rfl: 3   ASSESSMENT AND PLAN: .      ICD-10-CM   1. Coronary artery disease involving native coronary artery of native heart without angina pectoris  I25.10 EKG 12-Lead    rosuvastatin (CRESTOR) 20 MG tablet    rosuvastatin (CRESTOR) 10 MG tablet    2. Hypercholesteremia  E78.00 rosuvastatin (CRESTOR) 20 MG tablet    rosuvastatin (CRESTOR) 10 MG tablet    3. Essential hypertension  I10     4. Gastroesophageal reflux disease without esophagitis  K21.9 pantoprazole (PROTONIX) 40 MG tablet    5. Pain in other joint  M25.59 meloxicam (MOBIC) 15 MG tablet     Assessment and Plan    Stable Coronary Disease No progression of symptoms. Occasional chest discomfort during exercise, resolving within 10 minutes. No new symptoms. Metoprolol discontinued due to lethargy. -Continue current management. No need for additional imaging or plaque morphology evaluation at this time.  Hyperlipidemia LDL 36, HDL 38, Triglycerides 186 on rosuvastatin 20mg  on most days and 10mg  on Tuesdays and Thursdays. -Adjust rosuvastatin dosing to 10mg  on most days and 20mg  on Tuesdays and Thursdays. -Refill rosuvastatin prescription.  Diabetes A1c 6.2-6.3 on metformin. -  Consider switching from metformin to Jardiance 10mg  daily for cardioprotective and nephroprotective benefits. Decision pending patient consideration.  Joint  Pain Intermittent neck pain. -Refill Mobic 15mg  as needed for pain.  Gastroesophageal Reflux Disease Occasional symptoms. -Refill Protonix 40mg  as needed for symptoms.  Follow-up As needed.      Signed,  Yates Decamp, MD, [facility] 11/13/2023, 11:33 AM [facility] [address] [place], [place] [address] Phone: [phone]. Fax:  [phone]

## 2023-11-13 NOTE — Patient Instructions (Signed)
Medication Instructions:  Your physician recommends that you continue on your current medications as directed. Please refer to the Current Medication list given to you today.  *If you need a refill on your cardiac medications before your next appointment, please call your pharmacy*   Lab Work: none If you have labs (blood work) drawn today and your tests are completely normal, you will receive your results only by: MyChart Message (if you have MyChart) OR A paper copy in the mail If you have any lab test that is abnormal or we need to change your treatment, we will call you to review the results.   Testing/Procedures: none   Follow-Up: At [facility], you and your health needs are our priority.  As part of our continuing mission to provide you with exceptional heart care, we have created designated Provider Care Teams.  These Care Teams include your primary Cardiologist (physician) and Advanced Practice Providers (APPs -  Physician Assistants and Nurse Practitioners) who all work together to provide you with the care you need, when you need it.  We recommend signing up for the patient portal called "MyChart".  Sign up information is provided on this After Visit Summary.  MyChart is used to connect with patients for Virtual Visits (Telemedicine).  Patients are able to view lab/test results, encounter notes, upcoming appointments, etc.  Non-urgent messages can be sent to your provider as well.   To learn more about what you can do with MyChart, go to [email or web address].    Your next appointment:   As needed  Provider:   Yates Decamp, MD     Other Instructions

## 2024-01-17 ENCOUNTER — Other Ambulatory Visit (HOSPITAL_COMMUNITY): Payer: Self-pay

## 2024-01-19 ENCOUNTER — Other Ambulatory Visit (HOSPITAL_COMMUNITY): Payer: Self-pay

## 2024-01-19 MED ORDER — METFORMIN HCL ER 500 MG PO TB24
1000.0000 mg | ORAL_TABLET | Freq: Every evening | ORAL | 3 refills | Status: AC
  Filled 2024-01-19: qty 180, 90d supply, fill #0
  Filled 2024-04-14: qty 180, 90d supply, fill #1
  Filled 2024-06-23 – 2024-06-25 (×2): qty 180, 90d supply, fill #2
  Filled 2024-10-04: qty 180, 90d supply, fill #3

## 2024-03-03 DIAGNOSIS — H52203 Unspecified astigmatism, bilateral: Secondary | ICD-10-CM | POA: Diagnosis not present

## 2024-03-03 DIAGNOSIS — H524 Presbyopia: Secondary | ICD-10-CM | POA: Diagnosis not present

## 2024-03-03 DIAGNOSIS — H5203 Hypermetropia, bilateral: Secondary | ICD-10-CM | POA: Diagnosis not present

## 2024-04-14 ENCOUNTER — Other Ambulatory Visit (HOSPITAL_COMMUNITY): Payer: Self-pay

## 2024-05-17 DIAGNOSIS — R197 Diarrhea, unspecified: Secondary | ICD-10-CM | POA: Diagnosis not present

## 2024-05-17 DIAGNOSIS — R1084 Generalized abdominal pain: Secondary | ICD-10-CM | POA: Diagnosis not present

## 2024-05-21 ENCOUNTER — Other Ambulatory Visit: Payer: Self-pay

## 2024-05-21 ENCOUNTER — Other Ambulatory Visit (HOSPITAL_COMMUNITY): Payer: Self-pay

## 2024-05-21 MED ORDER — LEVOFLOXACIN 500 MG PO TABS
500.0000 mg | ORAL_TABLET | Freq: Every day | ORAL | 0 refills | Status: AC
  Filled 2024-05-21: qty 5, 5d supply, fill #0

## 2024-06-23 ENCOUNTER — Other Ambulatory Visit: Payer: Self-pay | Admitting: Cardiology

## 2024-06-23 ENCOUNTER — Other Ambulatory Visit (HOSPITAL_COMMUNITY): Payer: Self-pay

## 2024-06-23 ENCOUNTER — Other Ambulatory Visit: Payer: Self-pay

## 2024-06-23 DIAGNOSIS — I2510 Atherosclerotic heart disease of native coronary artery without angina pectoris: Secondary | ICD-10-CM

## 2024-06-23 MED ORDER — ASPIRIN 81 MG PO CHEW
81.0000 mg | CHEWABLE_TABLET | Freq: Every day | ORAL | 2 refills | Status: AC
  Filled 2024-06-23: qty 90, 90d supply, fill #0
  Filled 2024-10-04: qty 90, 90d supply, fill #1

## 2024-06-25 ENCOUNTER — Other Ambulatory Visit: Payer: Self-pay

## 2024-07-01 DIAGNOSIS — R319 Hematuria, unspecified: Secondary | ICD-10-CM | POA: Diagnosis not present

## 2024-07-01 DIAGNOSIS — R7309 Other abnormal glucose: Secondary | ICD-10-CM | POA: Diagnosis not present

## 2024-07-01 DIAGNOSIS — R5383 Other fatigue: Secondary | ICD-10-CM | POA: Diagnosis not present

## 2024-07-01 DIAGNOSIS — R7301 Impaired fasting glucose: Secondary | ICD-10-CM | POA: Diagnosis not present

## 2024-07-01 DIAGNOSIS — E782 Mixed hyperlipidemia: Secondary | ICD-10-CM | POA: Diagnosis not present

## 2024-07-08 DIAGNOSIS — E782 Mixed hyperlipidemia: Secondary | ICD-10-CM | POA: Diagnosis not present

## 2024-07-08 DIAGNOSIS — Z0000 Encounter for general adult medical examination without abnormal findings: Secondary | ICD-10-CM | POA: Diagnosis not present

## 2024-07-08 DIAGNOSIS — I2510 Atherosclerotic heart disease of native coronary artery without angina pectoris: Secondary | ICD-10-CM | POA: Diagnosis not present

## 2024-07-08 DIAGNOSIS — R7309 Other abnormal glucose: Secondary | ICD-10-CM | POA: Diagnosis not present

## 2024-08-28 ENCOUNTER — Other Ambulatory Visit (HOSPITAL_COMMUNITY): Payer: Self-pay

## 2024-08-31 ENCOUNTER — Other Ambulatory Visit: Payer: Self-pay

## 2024-10-04 ENCOUNTER — Other Ambulatory Visit (HOSPITAL_COMMUNITY): Payer: Self-pay

## 2024-10-04 ENCOUNTER — Other Ambulatory Visit: Payer: Self-pay | Admitting: Cardiology

## 2024-10-04 DIAGNOSIS — I2510 Atherosclerotic heart disease of native coronary artery without angina pectoris: Secondary | ICD-10-CM

## 2024-10-04 DIAGNOSIS — E7800 Pure hypercholesterolemia, unspecified: Secondary | ICD-10-CM

## 2024-10-05 ENCOUNTER — Other Ambulatory Visit (HOSPITAL_COMMUNITY): Payer: Self-pay

## 2024-10-05 ENCOUNTER — Other Ambulatory Visit: Payer: Self-pay

## 2024-10-05 MED ORDER — ROSUVASTATIN CALCIUM 10 MG PO TABS
10.0000 mg | ORAL_TABLET | Freq: Every day | ORAL | 0 refills | Status: AC
  Filled 2024-10-05 – 2024-10-07 (×2): qty 90, 90d supply, fill #0

## 2024-10-06 ENCOUNTER — Other Ambulatory Visit: Payer: Self-pay

## 2024-10-06 ENCOUNTER — Other Ambulatory Visit (HOSPITAL_COMMUNITY): Payer: Self-pay

## 2024-10-07 ENCOUNTER — Other Ambulatory Visit: Payer: Self-pay

## 2024-10-07 ENCOUNTER — Other Ambulatory Visit (HOSPITAL_COMMUNITY): Payer: Self-pay

## 2024-10-08 ENCOUNTER — Other Ambulatory Visit (HOSPITAL_COMMUNITY): Payer: Self-pay

## 2024-10-08 ENCOUNTER — Other Ambulatory Visit: Payer: Self-pay

## 2024-11-04 ENCOUNTER — Other Ambulatory Visit (HOSPITAL_COMMUNITY): Payer: Self-pay
